# Patient Record
Sex: Female | Born: 1995 | Race: Black or African American | Hispanic: No | Marital: Single | Smoking: Never smoker
Health system: Southern US, Community
[De-identification: ages and names within clinical notes are randomized; demographics above are authoritative.]

## PROBLEM LIST (undated history)

## (undated) DIAGNOSIS — L309 Dermatitis, unspecified: Secondary | ICD-10-CM

## (undated) HISTORY — DX: Dermatitis, unspecified: L30.9

## (undated) HISTORY — PX: NO PAST SURGERIES: SHX2092

---
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
## 2018-02-26 ENCOUNTER — Encounter: Payer: Self-pay | Admitting: Family

## 2018-02-26 ENCOUNTER — Ambulatory Visit: Payer: Self-pay | Admitting: Family

## 2018-02-26 VITALS — BP 118/82 | HR 77 | Temp 98.5°F | Resp 18 | Wt 201.4 lb

## 2018-02-26 DIAGNOSIS — A084 Viral intestinal infection, unspecified: Secondary | ICD-10-CM

## 2018-02-26 DIAGNOSIS — R197 Diarrhea, unspecified: Secondary | ICD-10-CM

## 2018-02-26 DIAGNOSIS — R110 Nausea: Secondary | ICD-10-CM

## 2018-02-26 MED ORDER — ONDANSETRON HCL 8 MG PO TABS
8.0000 mg | ORAL_TABLET | Freq: Three times a day (TID) | ORAL | 0 refills | Status: AC | PRN
Start: 2018-02-26 — End: ?

## 2018-02-26 NOTE — Patient Instructions (Signed)

## 2018-02-26 NOTE — Progress Notes (Signed)
Subjective:     Patient ID: Michelle Nunez, female   DOB: 1996/08/07, 22 y.o.   MRN: [id number]  HPI 22 year old female is in today with c/o nausea, vomiting, and diarrhea that began yesterday after eating [employer] chicken nuggets. She feels better this morning. Less diarrhea but still has some nausea. Was able to eat crackers without difficulty.  Review of Systems  Constitutional: Negative.  Negative for chills.  HENT: Negative.   Respiratory: Negative.   Cardiovascular: Negative.   Gastrointestinal: Positive for diarrhea and nausea.  Endocrine: Negative.   Genitourinary: Negative.   Musculoskeletal: Negative.    Past Medical History:  Diagnosis Date  . Eczema 03/20/2012    Social History   Socioeconomic History  . Marital status: Single    Spouse name: Not on file  . Number of children: Not on file  . Years of education: Not on file  . Highest education level: Not on file  Occupational History  . Not on file  Social Needs  . Financial resource strain: Not on file  . Food insecurity:    Worry: Not on file    Inability: Not on file  . Transportation needs:    Medical: Not on file    Non-medical: Not on file  Tobacco Use  . Smoking status: Passive Smoke Exposure - Never Smoker  Substance and Sexual Activity  . Alcohol use: No    Alcohol/week: 0.0 oz  . Drug use: No  . Sexual activity: Yes    Partners: Male    Birth control/protection: None  Lifestyle  . Physical activity:    Days per week: Not on file    Minutes per session: Not on file  . Stress: Not on file  Relationships  . Social connections:    Talks on phone: Not on file    Gets together: Not on file    Attends religious service: Not on file    Active member of club or organization: Not on file    Attends meetings of clubs or organizations: Not on file    Relationship status: Not on file  . Intimate partner violence:    Fear of current or ex partner: Not on file    Emotionally abused: Not on file   Physically abused: Not on file    Forced sexual activity: Not on file  Other Topics Concern  . Not on file  Social History Narrative  . Not on file    Past Surgical History:  Procedure Laterality Date  . NO PAST SURGERIES      Family History  Problem Relation Age of Onset  . Hypertension Father   . Breast cancer Maternal Aunt   . Prostate cancer Maternal Uncle     No Known Allergies  Current Outpatient Medications on File Prior to Visit  Medication Sig Dispense Refill  . acetaminophen (TYLENOL) 325 MG tablet Take 2 tablets (650 mg total) by mouth every 4 (four) hours as needed for moderate pain (for pain scale < 4). (Patient not taking: Reported on 02/26/2018) 90 tablet 0  . ibuprofen (ADVIL,MOTRIN) 600 MG tablet Take 1 tablet (600 mg total) by mouth every 6 (six) hours as needed for moderate pain. (Patient not taking: Reported on 02/26/2018) 30 tablet 0  . Prenatal Vit-Fe Fumarate-FA (MULTIVITAMIN-PRENATAL) 27-0.8 MG TABS tablet Take 1 tablet by mouth daily at 12 noon.    . senna-docusate (SENOKOT-S) 8.6-50 MG tablet Take 1 tablet by mouth at bedtime. (Patient not taking: Reported on 02/26/2018)  90 tablet 0  . SPRINTEC 28 0.25-35 MG-MCG tablet TAKE 1 TABLET BY MOUTH DAILY. (Patient not taking: Reported on 02/26/2018) 84 tablet 5   No current facility-administered medications on file prior to visit.     BP 118/82 (BP Location: Right Arm, Patient Position: Sitting, Cuff Size: Normal)   Pulse 77   Temp 98.5 F (36.9 C) (Oral)   Resp 18   Wt 201 lb 6.4 oz (91.4 kg)   SpO2 97% chart    Objective:   Physical Exam  Constitutional: She is oriented to person, place, and time. She appears well-developed and well-nourished.  HENT:  Mouth/Throat: Oropharynx is clear and moist.  Neck: Normal range of motion. Neck supple.  Cardiovascular: Normal rate and normal heart sounds.  Pulmonary/Chest: Effort normal and breath sounds normal.  Abdominal: Soft. Bowel sounds are normal. She  exhibits no distension. There is no tenderness.  Neurological: She is alert and oriented to person, place, and time.  Skin: Skin is warm and dry.  Psychiatric: She has a normal mood and affect.       Assessment:     Michelle Nunez was seen today for stomach bug.  Diagnoses and all orders for this visit:  Viral gastroenteritis  Diarrhea, unspecified type  Nausea  Other orders -     ondansetron (ZOFRAN) 8 MG tablet; Take 1 tablet (8 mg total) by mouth every 8 (eight) hours as needed for nausea or vomiting.      Plan:     RTC if symptoms worsen or persist. Recheck as needed

## 2018-07-20 ENCOUNTER — Other Ambulatory Visit: Payer: Self-pay

## 2018-07-20 ENCOUNTER — Encounter (HOSPITAL_COMMUNITY): Payer: Self-pay

## 2018-07-20 ENCOUNTER — Emergency Department (HOSPITAL_COMMUNITY)
Admission: EM | Admit: 2018-07-20 | Discharge: 2018-07-20 | Disposition: A | Discharge status: Home / Self Care | Payer: BLUE CROSS/BLUE SHIELD | Attending: Emergency Medicine | Admitting: Emergency Medicine

## 2018-07-20 DIAGNOSIS — L0291 Cutaneous abscess, unspecified: Secondary | ICD-10-CM

## 2018-07-20 DIAGNOSIS — M65052 Abscess of tendon sheath, left thigh: Secondary | ICD-10-CM | POA: Insufficient documentation

## 2018-07-20 DIAGNOSIS — Z79899 Other long term (current) drug therapy: Secondary | ICD-10-CM | POA: Insufficient documentation

## 2018-07-20 DIAGNOSIS — M79652 Pain in left thigh: Secondary | ICD-10-CM | POA: Diagnosis present

## 2018-07-20 MED ORDER — DOXYCYCLINE HYCLATE 100 MG PO CAPS: 100.0000 mg | ORAL_CAPSULE | Freq: Two times a day (BID) | ORAL | 0 refills | Status: AC

## 2018-07-20 MED ORDER — LIDOCAINE-EPINEPHRINE (PF) 2 %-1:200000 IJ SOLN
20.0000 mL | Freq: Once | INTRAMUSCULAR | Status: AC
  Administered 2018-07-20: 20 mL via INTRADERMAL
  Filled 2018-07-20: qty 20

## 2018-07-20 MED ORDER — NAPROXEN 500 MG PO TABS: 500.0000 mg | ORAL_TABLET | Freq: Two times a day (BID) | ORAL | 0 refills | Status: AC

## 2018-07-20 NOTE — ED Triage Notes (Signed)
Pt presents with 2-3 day h/o abscess to L buttock.  Pt reports bloody drainage that was noted today.

## 2018-07-20 NOTE — ED Provider Notes (Signed)
[place] EMERGENCY DEPARTMENT Provider Note   CSN: [id number] Arrival date & time: 07/20/18  1338     History   Chief Complaint Chief Complaint  Patient presents with  . Abscess    HPI Michelle Nunez is a 22 y.o. female.  HPI Pt noticed a sore on her buttock on the left side a few days ago.  Sx have been gettting worse.  The area is tender to the touch.  She also noticed some drainage today.  No fevers.  No vomiting Past Medical History:  Diagnosis Date  . Eczema 03/20/2012    There are no active problems to display for this patient.   Past Surgical History:  Procedure Laterality Date  . NO PAST SURGERIES       OB History    Gravida  1   Para  1   Term  1   Preterm      AB      Living  1     SAB      TAB      Ectopic      Multiple  0   Live Births  1            Home Medications    Prior to Admission medications   Medication Sig Start Date End Date Taking? Authorizing Provider  acetaminophen (TYLENOL) 325 MG tablet Take 2 tablets (650 mg total) by mouth every 4 (four) hours as needed for moderate pain (for pain scale < 4). Patient not taking: Reported on 02/26/2018 11/18/15   Smiley Houseman, MD  doxycycline (VIBRAMYCIN) 100 MG capsule Take 1 capsule (100 mg total) by mouth 2 (two) times daily for 7 days. 07/20/18 07/27/18  Dorie Rank, MD  ibuprofen (ADVIL,MOTRIN) 600 MG tablet Take 1 tablet (600 mg total) by mouth every 6 (six) hours as needed for moderate pain. Patient not taking: Reported on 02/26/2018 11/18/15   Smiley Houseman, MD  naproxen (NAPROSYN) 500 MG tablet Take 1 tablet (500 mg total) by mouth 2 (two) times daily with a meal. As needed for pain 07/20/18   Dorie Rank, MD  ondansetron (ZOFRAN) 8 MG tablet Take 1 tablet (8 mg total) by mouth every 8 (eight) hours as needed for nausea or vomiting. 02/26/18   Kennyth Arnold, FNP  Prenatal Vit-Fe Fumarate-FA (MULTIVITAMIN-PRENATAL) 27-0.8 MG TABS tablet Take 1 tablet  by mouth daily at 12 noon.    [provider]  senna-docusate (SENOKOT-S) 8.6-50 MG tablet Take 1 tablet by mouth at bedtime. Patient not taking: Reported on 02/26/2018 11/18/15   Smiley Houseman, MD  SPRINTEC 28 0.25-35 MG-MCG tablet TAKE 1 TABLET BY MOUTH DAILY. Patient not taking: Reported on 02/26/2018 10/20/16   Osborne Oman, MD    Family History Family History  Problem Relation Age of Onset  . Hypertension Father   . Breast cancer Maternal Aunt   . Prostate cancer Maternal Uncle     Social History Social History   Tobacco Use  . Smoking status: Never Smoker  . Smokeless tobacco: Never Used  Substance Use Topics  . Alcohol use: No    Alcohol/week: 0.0 standard drinks  . Drug use: No     Allergies   Patient has no known allergies.   Review of Systems Review of Systems  All other systems reviewed and are negative.    Physical Exam Updated Vital Signs BP 118/68   Pulse 89   Temp 98.4 F (36.9 C) (  Oral)   Resp 16   Ht 1.626 m (5\' 4" )   Wt 83.9 kg   SpO2 100%   BMI 31.76 kg/m   Physical Exam  Constitutional: She appears well-developed and well-nourished. No distress.  HENT:  Head: Normocephalic and atraumatic.  Right Ear: External ear normal.  Left Ear: External ear normal.  Eyes: Conjunctivae are normal. Right eye exhibits no discharge. Left eye exhibits no discharge. No scleral icterus.  Neck: Neck supple. No tracheal deviation present.  Cardiovascular: Normal rate.  Pulmonary/Chest: Effort normal. No stridor. No respiratory distress.  Abdominal: She exhibits no distension.  Musculoskeletal: She exhibits no edema.  Left proximal thigh area of induration and tenderness, small area on the skin with sponateous purulent drainage  Neurological: She is alert. Cranial nerve deficit: no gross deficits.  Skin: Skin is warm and dry. No rash noted.  Psychiatric: She has a normal mood and affect.  Nursing note and vitals reviewed.    ED  Treatments / Results  Labs (all labs ordered are listed, but only abnormal results are displayed) Labs Reviewed - No data to display  EKG None  Radiology No results found.  Procedures Procedures (including critical care time)  Medications Ordered in ED Medications  lidocaine-EPINEPHrine (XYLOCAINE W/EPI) 2 %-1:200000 (PF) injection 20 mL (20 mLs Intradermal Given by Other 07/20/18 1554)     Initial Impression / Assessment and Plan / ED Course  I have reviewed the triage vital signs and the nursing notes.  Pertinent labs & imaging results that were available during my care of the patient were reviewed by me and considered in my medical decision making (see chart for details).  Clinical Course as of Jul 20 1640  Sat Jul 20, 2018  1639 I&D performed by PA Valere Dross.     [JK]    Clinical Course User Index [JK] Dorie Rank, MD   Patient presented to the emergency room with a thigh abscess.  Patient tolerated I&D.  Plan on discharge home with course of antibiotics considering  the surrounding erythema.  Packing removal in 1 to 2 days.   Final Clinical Impressions(s) / ED Diagnoses   Final diagnoses:  Abscess    ED Discharge Orders         Ordered    doxycycline (VIBRAMYCIN) 100 MG capsule  2 times daily     07/20/18 1641    naproxen (NAPROSYN) 500 MG tablet  2 times daily with meals     07/20/18 1641           Dorie Rank, MD 07/20/18 1642

## 2018-07-20 NOTE — ED Provider Notes (Signed)
..  Incision and Drainage Date/Time: 07/20/2018 4:36 PM Performed by: Albesa Seen, PA-C Authorized by: Albesa Seen, PA-C   Consent:    Consent obtained:  Verbal   Consent given by:  Patient   Risks discussed:  Bleeding, incomplete drainage and pain Location:    Type:  Abscess   Location:  Lower extremity   Lower extremity location:  Leg   Leg location:  L upper leg Pre-procedure details:    Skin preparation:  Betadine Anesthesia (see MAR for exact dosages):    Anesthesia method:  Local infiltration   Local anesthetic:  Lidocaine 2% WITH epi Procedure type:    Complexity:  Simple Procedure details:    Needle aspiration: no     Incision type: Site aleady open, no incision needed.    Wound management:  Probed and deloculated, irrigated with saline and extensive cleaning   Drainage:  Bloody and purulent   Drainage amount:  Scant   Wound treatment:  Wound left open   Packing materials:  1/2 in iodoform gauze Post-procedure details:    Patient tolerance of procedure:  Tolerated well, no immediate complications      Tamala Julian 07/20/18 1637    Dorie Rank, MD 07/23/18 [phone]

## 2018-07-20 NOTE — Discharge Instructions (Addendum)
Take antibiotics as prescribed, follow-up in 1 to 2 days with a doctor to have the packing removed, continue to do warm soaks daily

## 2018-12-05 NOTE — Progress Notes (Signed)
HPV #2

## 2018-12-09 ENCOUNTER — Encounter: Payer: Self-pay | Admitting: Family Medicine

## 2018-12-09 ENCOUNTER — Ambulatory Visit (INDEPENDENT_AMBULATORY_CARE_PROVIDER_SITE_OTHER): Payer: Medicaid Other | Admitting: Family Medicine

## 2018-12-09 ENCOUNTER — Encounter: Payer: Self-pay | Admitting: *Deleted

## 2018-12-09 VITALS — BP 135/79 | HR 79 | Ht 65.0 in | Wt 206.0 lb

## 2018-12-09 DIAGNOSIS — Z0000 Encounter for general adult medical examination without abnormal findings: Secondary | ICD-10-CM | POA: Diagnosis not present

## 2018-12-09 DIAGNOSIS — Z23 Encounter for immunization: Secondary | ICD-10-CM

## 2018-12-09 DIAGNOSIS — Z3046 Encounter for surveillance of implantable subdermal contraceptive: Secondary | ICD-10-CM | POA: Diagnosis not present

## 2018-12-09 DIAGNOSIS — Z30017 Encounter for initial prescription of implantable subdermal contraceptive: Secondary | ICD-10-CM | POA: Diagnosis not present

## 2018-12-09 MED ORDER — ETONOGESTREL 68 MG ~~LOC~~ IMPL
68.0000 mg | DRUG_IMPLANT | Freq: Once | SUBCUTANEOUS | Status: AC
  Administered 2018-12-09: 68 mg via SUBCUTANEOUS

## 2018-12-09 NOTE — Progress Notes (Signed)
  Nexplanon Removal:  Patient given informed consent for removal of her Implanon, time out was performed.  Signed copy in the chart.  Appropriate time out taken. Nexplanon site identified in patient's LEFT.  Area prepped in usual sterile fashon. 66mL of 2% lidocaine with epi was used to anesthetize the area at the distal end of the implant. A small stab incision was made right beside the implant on the distal portion.  The implanon rod was grasped using hemostats and removed without difficulty.  There was less than 3 cc blood loss.   Nexplanon Insertion:  Patient given informed consent, signed copy in the chart, time out was performed.  Device confirmed in needle. The needle was inserted into the puncture wound already made by the removal in the LEFT arm, then inserted full length of needle and withdrawn per handbook instructions.  Device palpated by physician and patient.  Pt insertion site covered with pressure dressing.   Minimal blood loss.  Pt tolerated the procedure well.

## 2018-12-09 NOTE — Patient Instructions (Signed)
Nexplanon Instructions After Insertion  Keep bandage clean and dry for 24 hours  May use ice/Tylenol/Ibuprofen for soreness or pain  If you develop fever, drainage or increased warmth from incision site-contact office immediately   

## 2018-12-09 NOTE — Progress Notes (Signed)
GYNECOLOGY ANNUAL PREVENTATIVE CARE ENCOUNTER NOTE  Subjective:   Michelle Nunez is a 23 y.o. G53P1001 female here for a routine annual gynecologic exam.  Current complaints: none.   Denies abnormal vaginal bleeding, discharge, pelvic pain, problems with intercourse or other gynecologic concerns.    Gynecologic History No LMP recorded. Patient has had an implant. Patient is sexually active  Contraception: Nexplanon Last Pap: n/a. Last mammogram: n/a  Obstetric History OB History  Gravida Para Term Preterm AB Living  1 1 1     1   SAB TAB Ectopic Multiple Live Births        0 1    # Outcome Date GA Lbr Len/2nd Weight Sex Delivery Anes PTL Lv  1 Term 11/16/15 [redacted]w[redacted]d 15:00 / 01:35 5 lb 12.1 oz (2.61 kg) F Vag-Spont EPI  LIV    Past Medical History:  Diagnosis Date  . Eczema 03/20/2012    Past Surgical History:  Procedure Laterality Date  . NO PAST SURGERIES      Current Outpatient Medications on File Prior to Visit  Medication Sig Dispense Refill  . acetaminophen (TYLENOL) 325 MG tablet Take 2 tablets (650 mg total) by mouth every 4 (four) hours as needed for moderate pain (for pain scale < 4). (Patient not taking: Reported on 02/26/2018) 90 tablet 0  . ibuprofen (ADVIL,MOTRIN) 600 MG tablet Take 1 tablet (600 mg total) by mouth every 6 (six) hours as needed for moderate pain. (Patient not taking: Reported on 02/26/2018) 30 tablet 0  . naproxen (NAPROSYN) 500 MG tablet Take 1 tablet (500 mg total) by mouth 2 (two) times daily with a meal. As needed for pain (Patient not taking: Reported on 12/09/2018) 14 tablet 0  . ondansetron (ZOFRAN) 8 MG tablet Take 1 tablet (8 mg total) by mouth every 8 (eight) hours as needed for nausea or vomiting. (Patient not taking: Reported on 12/09/2018) 20 tablet 0  . Prenatal Vit-Fe Fumarate-FA (MULTIVITAMIN-PRENATAL) 27-0.8 MG TABS tablet Take 1 tablet by mouth daily at 12 noon.    . senna-docusate (SENOKOT-S) 8.6-50 MG tablet Take 1 tablet by mouth  at bedtime. (Patient not taking: Reported on 02/26/2018) 90 tablet 0  . SPRINTEC 28 0.25-35 MG-MCG tablet TAKE 1 TABLET BY MOUTH DAILY. (Patient not taking: Reported on 02/26/2018) 84 tablet 5   No current facility-administered medications on file prior to visit.     No Known Allergies  Social History   Socioeconomic History  . Marital status: Single    Spouse name: Not on file  . Number of children: Not on file  . Years of education: Not on file  . Highest education level: Not on file  Occupational History  . Not on file  Social Needs  . Financial resource strain: Not on file  . Food insecurity:    Worry: Not on file    Inability: Not on file  . Transportation needs:    Medical: Not on file    Non-medical: Not on file  Tobacco Use  . Smoking status: Never Smoker  . Smokeless tobacco: Never Used  Substance and Sexual Activity  . Alcohol use: No    Alcohol/week: 0.0 standard drinks  . Drug use: No  . Sexual activity: Yes    Partners: Male    Birth control/protection: Implant  Lifestyle  . Physical activity:    Days per week: Not on file    Minutes per session: Not on file  . Stress: Not on file  Relationships  .  Social connections:    Talks on phone: Not on file    Gets together: Not on file    Attends religious service: Not on file    Active member of club or organization: Not on file    Attends meetings of clubs or organizations: Not on file    Relationship status: Not on file  . Intimate partner violence:    Fear of current or ex partner: Not on file    Emotionally abused: Not on file    Physically abused: Not on file    Forced sexual activity: Not on file  Other Topics Concern  . Not on file  Social History Narrative  . Not on file    Family History  Problem Relation Age of Onset  . Hypertension Father   . Breast cancer Maternal Aunt   . Prostate cancer Maternal Uncle     The following portions of the patient's history were reviewed and updated as  appropriate: allergies, current medications, past family history, past medical history, past social history, past surgical history and problem list.  Review of Systems Pertinent items are noted in HPI.   Objective:  BP 135/79   Pulse 79   Ht 5\' 5"  (1.651 m)   Wt 206 lb (93.4 kg)   BMI 34.28 kg/m  Wt Readings from Last 3 Encounters:  12/09/18 206 lb (93.4 kg)  07/20/18 185 lb (83.9 kg)  02/26/18 201 lb 6.4 oz (91.4 kg)     CONSTITUTIONAL: Well-developed, well-nourished female in no acute distress.  HENT:  Normocephalic, atraumatic, External right and left ear normal. Oropharynx is clear and moist EYES: Conjunctivae and EOM are normal. Pupils are equal, round, and reactive to light. No scleral icterus.  NECK: Normal range of motion, supple, no masses.  Normal thyroid.   CARDIOVASCULAR: Normal heart rate noted, regular rhythm RESPIRATORY: Clear to auscultation bilaterally. Effort and breath sounds normal, no problems with respiration noted. BREASTS: Symmetric in size. No masses, skin changes, nipple drainage, or lymphadenopathy. ABDOMEN: Soft, normal bowel sounds, no distention noted.  No tenderness, rebound or guarding.  PELVIC: Normal appearing external genitalia; normal appearing vaginal mucosa and cervix.  No abnormal discharge noted.  Normal uterine size, no other palpable masses, no uterine or adnexal tenderness. MUSCULOSKELETAL: Normal range of motion. No tenderness.  No cyanosis, clubbing, or edema.  2+ distal pulses. SKIN: Skin is warm and dry. No rash noted. Not diaphoretic. No erythema. No pallor. NEUROLOGIC: Alert and oriented to person, place, and time. Normal reflexes, muscle tone coordination. No cranial nerve deficit noted. PSYCHIATRIC: Normal mood and affect. Normal behavior. Normal judgment and thought content.  Assessment:  Annual gynecologic examination with pap smear   Plan:  1. Well Woman Exam No concerns. Flu vaccine given. DIscussed gardasil - patient would  like to think about it. Handout given  2. Encounter for Nexplanon removal See separate note  3. Insertion of Nexplanon See separate procedure note.   Routine preventative health maintenance measures emphasized. Please refer to After Visit Summary for other counseling recommendations.    Loma Boston, [facility] for [employer]

## 2019-11-10 ENCOUNTER — Encounter: Payer: Self-pay | Admitting: Radiology

## 2020-03-24 ENCOUNTER — Emergency Department (HOSPITAL_COMMUNITY): Payer: Self-pay

## 2020-03-24 ENCOUNTER — Other Ambulatory Visit: Payer: Self-pay

## 2020-03-24 ENCOUNTER — Emergency Department (HOSPITAL_COMMUNITY)
Admission: EM | Admit: 2020-03-24 | Discharge: 2020-03-24 | Disposition: A | Discharge status: Home / Self Care | Payer: Self-pay | Attending: Emergency Medicine | Admitting: Emergency Medicine

## 2020-03-24 DIAGNOSIS — D329 Benign neoplasm of meninges, unspecified: Secondary | ICD-10-CM | POA: Insufficient documentation

## 2020-03-24 DIAGNOSIS — Z20822 Contact with and (suspected) exposure to covid-19: Secondary | ICD-10-CM | POA: Insufficient documentation

## 2020-03-24 DIAGNOSIS — R519 Headache, unspecified: Secondary | ICD-10-CM | POA: Insufficient documentation

## 2020-03-24 LAB — SARS CORONAVIRUS 2 BY RT PCR (HOSPITAL ORDER, PERFORMED IN ~~LOC~~ HOSPITAL LAB): SARS Coronavirus 2: NEGATIVE

## 2020-03-24 MED ORDER — KETOROLAC TROMETHAMINE 60 MG/2ML IM SOLN
60.0000 mg | Freq: Once | INTRAMUSCULAR | Status: AC
  Administered 2020-03-24: 20:00:00 60 mg via INTRAMUSCULAR
  Filled 2020-03-24: qty 2

## 2020-03-24 NOTE — ED Provider Notes (Signed)
[facility] EMERGENCY DEPARTMENT Provider Note   CSN: [id number] Arrival date & time: 03/24/20  1817     History Chief Complaint  Patient presents with  . Headache    Michelle Nunez is a 24 y.o. female.  Patient-year-old female with past medical history of eczema presenting to the emergency department for frontal headache since Tuesday morning.  Reports that she has no history of headaches in the past.  She has throbbing pain in her head which is worse with moving her head.  Reports also feeling hot and cold chills.  Denies any other symptoms.        Past Medical History:  Diagnosis Date  . Eczema 03/20/2012    There are no problems to display for this patient.   Past Surgical History:  Procedure Laterality Date  . NO PAST SURGERIES       OB History    Gravida  1   Para  1   Term  1   Preterm      AB      Living  1     SAB      TAB      Ectopic      Multiple  0   Live Births  1           Family History  Problem Relation Age of Onset  . Hypertension Father   . Breast cancer Maternal Aunt   . Prostate cancer Maternal Uncle     Social History   Tobacco Use  . Smoking status: Never Smoker  . Smokeless tobacco: Never Used  Substance Use Topics  . Alcohol use: No    Alcohol/week: 0.0 standard drinks  . Drug use: No    Home Medications Prior to Admission medications   Medication Sig Start Date End Date Taking? Authorizing Provider  acetaminophen (TYLENOL) 325 MG tablet Take 2 tablets (650 mg total) by mouth every 4 (four) hours as needed for moderate pain (for pain scale < 4). Patient not taking: Reported on 02/26/2018 11/18/15   Smiley Houseman, MD  ibuprofen (ADVIL,MOTRIN) 600 MG tablet Take 1 tablet (600 mg total) by mouth every 6 (six) hours as needed for moderate pain. Patient not taking: Reported on 02/26/2018 11/18/15   Smiley Houseman, MD  naproxen (NAPROSYN) 500 MG tablet Take 1 tablet (500 mg total) by  mouth 2 (two) times daily with a meal. As needed for pain Patient not taking: Reported on 12/09/2018 07/20/18   Dorie Rank, MD  ondansetron (ZOFRAN) 8 MG tablet Take 1 tablet (8 mg total) by mouth every 8 (eight) hours as needed for nausea or vomiting. Patient not taking: Reported on 12/09/2018 02/26/18   Dutch Quint B, FNP  senna-docusate (SENOKOT-S) 8.6-50 MG tablet Take 1 tablet by mouth at bedtime. Patient not taking: Reported on 02/26/2018 11/18/15   Smiley Houseman, MD  SPRINTEC 28 0.25-35 MG-MCG tablet TAKE 1 TABLET BY MOUTH DAILY. Patient not taking: Reported on 02/26/2018 10/20/16   Osborne Oman, MD    Allergies    Patient has no known allergies.  Review of Systems   Review of Systems  Constitutional: Positive for chills. Negative for appetite change and fatigue.  HENT: Negative.   Respiratory: Negative for cough and shortness of breath.   Cardiovascular: Negative for chest pain.  Gastrointestinal: Negative for abdominal pain, diarrhea, nausea and vomiting.  Genitourinary: Negative.   Musculoskeletal: Negative.   Skin: Negative for rash.  Neurological: Positive for  headaches. Negative for dizziness, tremors, seizures, syncope, speech difficulty, weakness and light-headedness.  Psychiatric/Behavioral: Negative for confusion.    Physical Exam Updated Vital Signs BP 118/81   Pulse (!) 111   Temp 100 F (37.8 C)   Resp 18   SpO2 93%   Physical Exam Vitals and nursing note reviewed.  Constitutional:      General: She is not in acute distress.    Appearance: Normal appearance. She is well-developed. She is not ill-appearing, toxic-appearing or diaphoretic.  HENT:     Head: Normocephalic.  Eyes:     General: No visual field deficit.    Extraocular Movements: Extraocular movements intact.     Right eye: Normal extraocular motion and no nystagmus.     Left eye: Normal extraocular motion and no nystagmus.     Conjunctiva/sclera: Conjunctivae normal.     Pupils:  Pupils are equal, round, and reactive to light.  Cardiovascular:     Rate and Rhythm: Normal rate and regular rhythm.  Pulmonary:     Effort: Pulmonary effort is normal.  Abdominal:     Palpations: Abdomen is soft.  Musculoskeletal:     Cervical back: Neck supple.  Skin:    General: Skin is warm and dry.  Neurological:     Mental Status: She is alert.     Cranial Nerves: No cranial nerve deficit, dysarthria or facial asymmetry.     Sensory: No sensory deficit.     Motor: No weakness.     Gait: Gait normal.  Psychiatric:        Mood and Affect: Mood normal.     ED Results / Procedures / Treatments   Labs (all labs ordered are listed, but only abnormal results are displayed) Labs Reviewed  SARS CORONAVIRUS 2 BY RT PCR ([facility])    EKG None  Radiology CT Head Wo Contrast  Result Date: 03/24/2020 CLINICAL DATA:  Headache. EXAM: CT HEAD WITHOUT CONTRAST TECHNIQUE: Contiguous axial images were obtained from the base of the skull through the vertex without intravenous contrast. COMPARISON:  None. FINDINGS: Brain: No evidence of acute infarction, hemorrhage, hydrocephalus, extra-axial collection or mass lesion/mass effect. A 7 mm curvilinear focus of calcification is seen within the posterior aspect of the right lateral ventricle (axial CT image 17, CT series number 3). Vascular: No hyperdense vessel is identified. Skull: Normal. Negative for fracture or focal lesion. Sinuses/Orbits: No acute finding. Other: None. IMPRESSION: 1. No acute intracranial abnormality. 2. 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 MRI correlation is recommended. Electronically Signed   By: Virgina Norfolk M.D.   On: 03/24/2020 21:19    Procedures Procedures (including critical care time)  Medications Ordered in ED Medications  ketorolac  (TORADOL) injection 60 mg (60 mg Intramuscular Given 03/24/20 1953)    ED Course  I have reviewed the triage vital signs and the nursing notes.  Pertinent labs & imaging results that were available during my care of the patient were reviewed by me and considered in my medical decision making (see chart for details).  Clinical Course as of Mar 24 2133  Wed Mar 24, 2020  2009 Patient presenting with headache and chills. Well appearing, normal neuro exam. Patient given toradol and swabbed for covid. Patient asking for head CT. Discussed with patient that this is not necessary given her symptoms. Patient reports family hx of spontaneous  subarachnoid hemorrhage.  I offered further reassurance to the patient that this likely is not the cause of her headache.  Patient insist that she get a head CT scan and patient's mother also and states that head CT scan be performed.  Discussed risks and benefits of a head CT with the patient including exposure to radiation, cost, time and a likely negative exam.  Patient reports she understands but still insists on head CT for her headache.  Head CT was ordered.   [KM]    Clinical Course User Index [KM] Kristine Royal   MDM Rules/Calculators/A&P                      Based on review of vitals, medical screening exam, lab work and/or imaging, there does not appear to be an acute, emergent etiology for the patient's symptoms. Counseled pt on good return precautions and encouraged both PCP and ED follow-up as needed.  Prior to discharge, I also discussed incidental imaging findings with patient in detail and advised appropriate, recommended follow-up in detail.  Clinical Impression: 1. Acute nonintractable headache, unspecified headache type     Disposition: Discharge  Prior to providing a prescription for a controlled substance, I independently reviewed the patient's recent prescription history on the [facility].  The patient had no recent or regular prescriptions and was deemed appropriate for a brief, less than 3 day prescription of narcotic for acute analgesia.  This note was prepared with assistance of Systems analyst. Occasional wrong-word or sound-a-like substitutions may have occurred due to the inherent limitations of voice recognition software.  Final Clinical Impression(s) / ED Diagnoses Final diagnoses:  Acute nonintractable headache, unspecified headache type    Rx / DC Orders ED Discharge Orders    None       Kristine Royal 03/24/20 2135    Charlesetta Shanks, MD 03/26/20 1626

## 2020-03-24 NOTE — ED Triage Notes (Signed)
Pt here with reports of throbbing headache since yesterday. Pt also states sometimes at night she gets hot. Pt in NAD.

## 2020-03-24 NOTE — Discharge Instructions (Signed)
You are seen today for headache.  Your Covid test is negative.  Your CT scan was reassuring and there is no signs of any bleed.  Incidentally, we did see that there is a tiny benign tumor called a meningioma on her CT scan.  Meningioma is a usually noncancerous tumor that arises from the membranes surrounding the brain and spinal cord. It isn't clear what causes a meningioma. Radiation therapy, female hormones, and genetics may play a role.  It is recommended that you follow-up with your primary medical doctor who may perform other imaging studies such as an MRI.  Continue to take Motrin and/or Tylenol for your headache.

## 2020-03-25 ENCOUNTER — Encounter (HOSPITAL_COMMUNITY): Payer: Self-pay | Admitting: Pediatrics

## 2020-03-25 ENCOUNTER — Other Ambulatory Visit: Payer: Self-pay

## 2020-03-25 ENCOUNTER — Emergency Department (HOSPITAL_COMMUNITY)
Admission: EM | Admit: 2020-03-25 | Discharge: 2020-03-25 | Disposition: A | Discharge status: Home / Self Care | Payer: Medicaid Other | Attending: Emergency Medicine | Admitting: Emergency Medicine

## 2020-03-25 DIAGNOSIS — R509 Fever, unspecified: Secondary | ICD-10-CM | POA: Insufficient documentation

## 2020-03-25 DIAGNOSIS — E860 Dehydration: Secondary | ICD-10-CM | POA: Insufficient documentation

## 2020-03-25 DIAGNOSIS — R519 Headache, unspecified: Secondary | ICD-10-CM | POA: Insufficient documentation

## 2020-03-25 DIAGNOSIS — R112 Nausea with vomiting, unspecified: Secondary | ICD-10-CM | POA: Insufficient documentation

## 2020-03-25 MED ORDER — ONDANSETRON 4 MG PO TBDP: 4.0000 mg | ORAL_TABLET | Freq: Three times a day (TID) | ORAL | 0 refills | Status: AC | PRN

## 2020-03-25 MED ORDER — IBUPROFEN 400 MG PO TABS: 600.0000 mg | ORAL_TABLET | Freq: Once | ORAL | Status: DC

## 2020-03-25 MED ORDER — IBUPROFEN 800 MG PO TABS
800.0000 mg | ORAL_TABLET | Freq: Once | ORAL | Status: AC
  Administered 2020-03-25: 800 mg via ORAL
  Filled 2020-03-25: qty 1

## 2020-03-25 MED ORDER — ACETAMINOPHEN 325 MG PO TABS
650.0000 mg | ORAL_TABLET | Freq: Once | ORAL | Status: AC
  Administered 2020-03-25: 650 mg via ORAL
  Filled 2020-03-25: qty 2

## 2020-03-25 MED ORDER — ONDANSETRON 4 MG PO TBDP
8.0000 mg | ORAL_TABLET | Freq: Once | ORAL | Status: AC
  Administered 2020-03-25: 8 mg via ORAL
  Filled 2020-03-25: qty 2

## 2020-03-25 NOTE — ED Notes (Signed)
Discussed elevated temp with PA

## 2020-03-25 NOTE — ED Notes (Signed)
Discharge instructions including medications discussed with pt. Pt verbalized understanding with no questions at this time.

## 2020-03-25 NOTE — Discharge Instructions (Addendum)
Your headache is likely secondary to a viral illness of some kind. Please use Tylenol or ibuprofen for pain.  You may use 600 mg ibuprofen every 6 hours or 1000 mg of Tylenol every 6 hours.  You may choose to alternate between the 2.  This would be most effective.  Not to exceed 4 g of Tylenol within 24 hours.  Not to exceed 3200 mg ibuprofen 24 hours.

## 2020-03-25 NOTE — ED Provider Notes (Signed)
[facility] EMERGENCY DEPARTMENT Provider Note   CSN: [id number] Arrival date & time: 03/25/20  1751     History Chief Complaint  Patient presents with  . Headache    Michelle Nunez is a 24 y.o. female.  HPI Patient is a 24 year old female with past medical history significant for eczema presenting today with headache that started Tuesday and has been intermittent since.  She states that she was seen yesterday emergency department and requested a head CT.  She states that she was given medications that made her feel better and she went home and states that her headache came back after 6 or 7 hours.  She states that she took 1 tablet of Tylenol which did not improve her headache which prompted her to return to ED this evening for reevaluation.  She endorses some nausea and one episode of vomiting.  She denies any abdominal pain, dysuria, frequency, urgency, hematuria, denies pelvic pain, flank pain, chest pain or shortness of breath.  She denies any cough, cold, congestion, runny nose, sore throat.  She denies any neck stiffness or blurry vision, denies any confusion or lethargy.  She states that she has not felt that she had a fever and denies any chills.      Past Medical History:  Diagnosis Date  . Eczema 03/20/2012    There are no problems to display for this patient.   Past Surgical History:  Procedure Laterality Date  . NO PAST SURGERIES       OB History    Gravida  1   Para  1   Term  1   Preterm      AB      Living  1     SAB      TAB      Ectopic      Multiple  0   Live Births  1           Family History  Problem Relation Age of Onset  . Hypertension Father   . Breast cancer Maternal Aunt   . Prostate cancer Maternal Uncle     Social History   Tobacco Use  . Smoking status: Never Smoker  . Smokeless tobacco: Never Used  Substance Use Topics  . Alcohol use: No    Alcohol/week: 0.0 standard drinks  . Drug use: No     Home Medications Prior to Admission medications   Medication Sig Start Date End Date Taking? Authorizing Provider  acetaminophen (TYLENOL) 325 MG tablet Take 2 tablets (650 mg total) by mouth every 4 (four) hours as needed for moderate pain (for pain scale < 4). Patient not taking: Reported on 02/26/2018 11/18/15   Smiley Houseman, MD  ibuprofen (ADVIL,MOTRIN) 600 MG tablet Take 1 tablet (600 mg total) by mouth every 6 (six) hours as needed for moderate pain. Patient not taking: Reported on 02/26/2018 11/18/15   Smiley Houseman, MD  naproxen (NAPROSYN) 500 MG tablet Take 1 tablet (500 mg total) by mouth 2 (two) times daily with a meal. As needed for pain Patient not taking: Reported on 12/09/2018 07/20/18   Dorie Rank, MD  ondansetron (ZOFRAN ODT) 4 MG disintegrating tablet Take 1 tablet (4 mg total) by mouth every 8 (eight) hours as needed for nausea or vomiting. 03/25/20   Reade Trefz, Kathleene Hazel, PA  ondansetron (ZOFRAN) 8 MG tablet Take 1 tablet (8 mg total) by mouth every 8 (eight) hours as needed for nausea or vomiting. Patient not  taking: Reported on 12/09/2018 02/26/18   Kennyth Arnold, FNP  senna-docusate (SENOKOT-S) 8.6-50 MG tablet Take 1 tablet by mouth at bedtime. Patient not taking: Reported on 02/26/2018 11/18/15   Smiley Houseman, MD  SPRINTEC 28 0.25-35 MG-MCG tablet TAKE 1 TABLET BY MOUTH DAILY. Patient not taking: Reported on 02/26/2018 10/20/16   Osborne Oman, MD    Allergies    Patient has no known allergies.  Review of Systems   Review of Systems  Constitutional: Negative for chills and fever.  HENT: Negative for congestion.   Eyes: Negative for pain.  Respiratory: Negative for cough and shortness of breath.   Cardiovascular: Negative for chest pain and leg swelling.  Gastrointestinal: Positive for nausea and vomiting. Negative for abdominal pain.  Genitourinary: Negative for dysuria.  Musculoskeletal: Negative for myalgias.  Skin: Negative for rash.   Neurological: Positive for headaches. Negative for dizziness.    Physical Exam Updated Vital Signs BP 123/69 (BP Location: Left Arm)   Pulse 86   Temp (!) 100.5 F (38.1 C) (Oral)   Resp 20   SpO2 100%   Physical Exam Vitals and nursing note reviewed.  Constitutional:      General: She is not in acute distress.    Comments: Patient is alert, pleasant 24 year old female no acute distress sitting comfortably in bed texting on her phone.  Patient is conversant and well-appearing.  She does not appear toxic or confused.  HENT:     Head: Normocephalic and atraumatic.     Nose: Nose normal.     Mouth/Throat:     Comments: Dry oral mucosa Eyes:     General: No scleral icterus. Cardiovascular:     Rate and Rhythm: Normal rate and regular rhythm.     Pulses: Normal pulses.     Heart sounds: Normal heart sounds.  Pulmonary:     Effort: Pulmonary effort is normal. No respiratory distress.     Breath sounds: No wheezing.  Abdominal:     Palpations: Abdomen is soft.     Tenderness: There is no abdominal tenderness. There is no right CVA tenderness, left CVA tenderness, guarding or rebound.  Musculoskeletal:     Cervical back: Normal range of motion.     Right lower leg: No edema.     Left lower leg: No edema.     Comments: Strength 5/5 bilateral lower and upper extremities flexion-extension of the knees, ankles, grip of hands, flexion-extension of elbow.  Skin:    General: Skin is warm and dry.     Capillary Refill: Capillary refill takes less than 2 seconds.     Comments: No rashes  Neurological:     Mental Status: She is alert. Mental status is at baseline.     Comments: Alert and oriented to self, place, time and event.   Speech is fluent, clear without dysarthria or dysphasia.   Strength 5/5 in upper/lower extremities  Sensation intact in upper/lower extremities   Normal gait.  Negative Romberg. No pronator drift.  Normal finger-to-nose and feet tapping.  CN I not  tested  CN II grossly intact visual fields bilaterally. Did not visualize posterior eye.   CN III, IV, VI PERRLA and EOMs intact bilaterally  CN V Intact sensation to sharp and light touch to the face  CN VII facial movements symmetric  CN VIII not tested  CN IX, X no uvula deviation, symmetric rise of soft palate  CN XI 5/5 SCM and trapezius strength bilaterally  CN  XII Midline tongue protrusion, symmetric L/R movements   Psychiatric:        Mood and Affect: Mood normal.        Behavior: Behavior normal.     ED Results / Procedures / Treatments   Labs (all labs ordered are listed, but only abnormal results are displayed) Labs Reviewed - No data to display  EKG None  Radiology CT Head Wo Contrast  Result Date: 03/24/2020 CLINICAL DATA:  Headache. EXAM: CT HEAD WITHOUT CONTRAST TECHNIQUE: Contiguous axial images were obtained from the base of the skull through the vertex without intravenous contrast. COMPARISON:  None. FINDINGS: Brain: No evidence of acute infarction, hemorrhage, hydrocephalus, extra-axial collection or mass lesion/mass effect. A 7 mm curvilinear focus of calcification is seen within the posterior aspect of the right lateral ventricle (axial CT image 17, CT series number 3). Vascular: No hyperdense vessel is identified. Skull: Normal. Negative for fracture or focal lesion. Sinuses/Orbits: No acute finding. Other: None. IMPRESSION: 1. No acute intracranial abnormality. 2. 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 MRI correlation is recommended. Electronically Signed   By: Virgina Norfolk M.D.   On: 03/24/2020 21:19    Procedures Procedures (including critical care time)  Medications Ordered in ED Medications  acetaminophen (TYLENOL) tablet 650 mg (650 mg Oral Given 03/25/20 2003)  ondansetron (ZOFRAN-ODT) disintegrating tablet 8 mg (8 mg Oral Given  03/25/20 2003)  ibuprofen (ADVIL) tablet 800 mg (800 mg Oral Given 03/25/20 2002)    ED Course  I have reviewed the triage vital signs and the nursing notes.  Pertinent labs & imaging results that were available during my care of the patient were reviewed by me and considered in my medical decision making (see chart for details).    MDM Rules/Calculators/A&P                      Patient is a 24 year old female with a circumferential headache but is been ongoing intermittently since Tuesday.  She went to emergency department yesterday states that she felt better and returned today when it got worse.  She states that between the 2 ER visit she only took 1 500 mg Tylenol.  She states that did not improve her symptoms and therefore she came back.  On my review of EMR patient was discharged from ER yesterday after 60 mg Toradol injection; patient had a CT scan of the head done at her ED visit yesterday.  There was a small calcified area that may be a questionable meningioma.  Her headaches are intermittent she has an associated fever I have very low suspicion for the meningioma causing her headaches.  She has had some nausea and one episode of vomiting however she has no blurry vision.  Very low suspicion for increased ICP causing her symptoms and very low suspicion for meningitis/encephalitis.  She has vital signs within normal limits apart from the fever.  Given that she has a fever and has not been treating it with any medications nor she been taking Tylenol or ibuprofen--I suspect that her symptoms would improve given these medications.  We will treat her with Tylenol and ibuprofen.  Will have her eat and drink here as she has had nothing to eat today has only had some soda to drink.  Will reevaluate.  I discussed case my attending physician who is agreeable to plan.  I reassessed patient after  she was given Tylenol ibuprofen.  She states her pain is significantly improved.  Her fever has improved to  100.5 and her heart rate improved to 86.  She has tolerated p.o. has had no nausea or vomiting since she presented to emergency department.  Will discharge at this time with Zofran and recommendations on Tylenol ibuprofen use.  I discussed with patient my concern for Covid as she did have a negative Covid test done yesterday no negation for repeat however it is possible that she had a false negative.  She also does appear somewhat dehydrated on physical exam.  Riddhi K Strausser was evaluated in Emergency Department on 03/25/2020 for the symptoms described in the history of present illness. She was evaluated in the context of the global COVID-19 pandemic, which necessitated consideration that the patient might be at risk for infection with the SARS-CoV-2 virus that causes COVID-19. Institutional protocols and algorithms that pertain to the evaluation of patients at risk for COVID-19 are in a state of rapid change based on information released by regulatory bodies including the CDC and federal and state organizations. These policies and algorithms were followed during the patient's care in the ED.   Final Clinical Impression(s) / ED Diagnoses Final diagnoses:  Bad headache  Dehydration  Fever, unspecified fever cause    Rx / DC Orders ED Discharge Orders         Ordered    ondansetron (ZOFRAN ODT) 4 MG disintegrating tablet  Every 8 hours PRN     03/25/20 2122           Tedd Sias, [place] 03/25/20 2123    Carmin Muskrat, MD 03/25/20 2141

## 2020-03-25 NOTE — ED Triage Notes (Signed)
Pt c/o headache started Monday / Tuesday night; stated she was seen yesterday and was discharge. Pt stated she took some tylenol and it doesn't work.

## 2021-06-21 ENCOUNTER — Other Ambulatory Visit: Payer: Self-pay

## 2021-06-21 ENCOUNTER — Ambulatory Visit (INDEPENDENT_AMBULATORY_CARE_PROVIDER_SITE_OTHER): Payer: Medicaid Other | Admitting: Family Medicine

## 2021-06-21 ENCOUNTER — Encounter: Payer: Self-pay | Admitting: Family Medicine

## 2021-06-21 VITALS — BP 143/82 | HR 71 | Wt 222.0 lb

## 2021-06-21 DIAGNOSIS — Z3009 Encounter for other general counseling and advice on contraception: Secondary | ICD-10-CM

## 2021-06-21 DIAGNOSIS — Z3046 Encounter for surveillance of implantable subdermal contraceptive: Secondary | ICD-10-CM

## 2021-06-21 NOTE — Progress Notes (Signed)
  Contraception/Family Planning VISIT ENCOUNTER NOTE  Subjective:   Michelle Nunez is a 25 y.o. G69P1001 female here for reproductive life counseling.  Desires effectiveness from [facility] [place].  Reports she does not want a pregnancy in the next year. Nexplanon placed in 2020.   Denies abnormal vaginal bleeding, discharge, pelvic pain, problems with intercourse or other gynecologic concerns.    Gynecologic History No LMP recorded. Patient has had an implant. Contraception: Nexplanon  Health Maintenance Due  Topic Date Due   COVID-19 Vaccine (1) Never done   HPV VACCINES (1 - 2-dose series) Never done   Hepatitis C Screening  Never done   CHLAMYDIA SCREENING  11/09/2016   PAP-Cervical Cytology Screening  Never done   PAP SMEAR-Modifier  Never done   INFLUENZA VACCINE  06/13/2021   The following portions of the patient's history were reviewed and updated as appropriate: allergies, current medications, past family history, past medical history, past social history, past surgical history and problem list.  Review of Systems Pertinent items are noted in HPI.   Objective:  BP (!) 143/82   Pulse 71   Wt 222 lb (100.7 kg)   BMI 36.94 kg/m  Gen: well appearing, NAD HEENT: no scleral icterus CV: RR Lung: Normal WOB Ext: warm well perfused  PELVIC: not indicated- declined pap toda, Declined STI screening. Only wanted nexplanon removed/contraceptive discussion  Nexplanon Removal Patient identified, informed consent performed, consent signed.   Appropriate time out taken. Nexplanon site identified.  Area prepped in usual sterile fashon. 3 ml of 1% lidocaine with Epinephrine was used to anesthetize the area at the distal end of the implant and along implant site. A small stab incision was made right beside the implant on the distal portion.  The Nexplanon rod was grasped using hemostats and removed without difficulty.  There was minimal blood loss. There were no complications.  Steri-strips were  applied over the small incision.  A pressure bandage was applied to reduce any bruising.  The patient tolerated the procedure well and was given post procedure instructions.      Assessment and Plan:   Contraception counseling: Reviewed all forms of birth control options in the tiered based approach. available including abstinence; over the counter/barrier methods; hormonal contraceptive medication including pill, patch, ring, injection,contraceptive implant, ECP; hormonal and nonhormonal IUDs; permanent sterilization options including vasectomy and the various tubal sterilization modalities. Risks, benefits, and typical effectiveness rates were reviewed.  Questions were answered.  Written information was also given to the patient to review.    Patient desires no method-- Discussed that she DOES NOT want a pregnancy and that given she is sexually active she is at risk for pregnancy. Declined method today despite counseling. If patient calls and desire to start a method happy to prescribe OCP or depo.   She will follow up in  3-4 for surveillance.  She was told to call with any further questions, or with any concerns about this method of contraception.  Emphasized use of condoms 100% of the time for STI prevention.  #HM - declined pap and STI screening today  Please refer to After Visit Summary for other counseling recommendations.   Return in about 4 weeks (around 07/19/2021) for Yearly wellness exam, pap.  Caren Macadam, MD

## 2021-07-12 ENCOUNTER — Emergency Department (HOSPITAL_COMMUNITY)
Admission: EM | Admit: 2021-07-12 | Discharge: 2021-07-12 | Disposition: A | Discharge status: Home / Self Care | Payer: Worker's Compensation | Attending: Student | Admitting: Student

## 2021-07-12 ENCOUNTER — Emergency Department (HOSPITAL_COMMUNITY): Payer: Worker's Compensation

## 2021-07-12 ENCOUNTER — Other Ambulatory Visit: Payer: Self-pay

## 2021-07-12 ENCOUNTER — Emergency Department (HOSPITAL_COMMUNITY): Payer: Medicaid Other

## 2021-07-12 ENCOUNTER — Encounter (HOSPITAL_COMMUNITY): Payer: Self-pay | Admitting: *Deleted

## 2021-07-12 DIAGNOSIS — Z23 Encounter for immunization: Secondary | ICD-10-CM | POA: Insufficient documentation

## 2021-07-12 DIAGNOSIS — S62634A Displaced fracture of distal phalanx of right ring finger, initial encounter for closed fracture: Secondary | ICD-10-CM

## 2021-07-12 DIAGNOSIS — S62604A Fracture of unspecified phalanx of right ring finger, initial encounter for closed fracture: Secondary | ICD-10-CM | POA: Insufficient documentation

## 2021-07-12 DIAGNOSIS — W228XXA Striking against or struck by other objects, initial encounter: Secondary | ICD-10-CM | POA: Diagnosis not present

## 2021-07-12 DIAGNOSIS — Y990 Civilian activity done for income or pay: Secondary | ICD-10-CM | POA: Diagnosis not present

## 2021-07-12 DIAGNOSIS — M79644 Pain in right finger(s): Secondary | ICD-10-CM | POA: Diagnosis not present

## 2021-07-12 DIAGNOSIS — S61309A Unspecified open wound of unspecified finger with damage to nail, initial encounter: Secondary | ICD-10-CM

## 2021-07-12 DIAGNOSIS — S6991XA Unspecified injury of right wrist, hand and finger(s), initial encounter: Secondary | ICD-10-CM | POA: Diagnosis present

## 2021-07-12 MED ORDER — CEFAZOLIN SODIUM-DEXTROSE 1-4 GM/50ML-% IV SOLN
1.0000 g | Freq: Once | INTRAVENOUS | Status: DC
  Filled 2021-07-12: qty 50

## 2021-07-12 MED ORDER — TETANUS-DIPHTH-ACELL PERTUSSIS 5-2.5-18.5 LF-MCG/0.5 IM SUSY
0.5000 mL | PREFILLED_SYRINGE | Freq: Once | INTRAMUSCULAR | Status: AC
  Administered 2021-07-12: 0.5 mL via INTRAMUSCULAR
  Filled 2021-07-12: qty 0.5

## 2021-07-12 MED ORDER — LIDOCAINE HCL (PF) 1 % IJ SOLN
30.0000 mL | Freq: Once | INTRAMUSCULAR | Status: AC
  Administered 2021-07-12: 30 mL
  Filled 2021-07-12: qty 30

## 2021-07-12 MED ORDER — CEFAZOLIN SODIUM 1 G IJ SOLR
1.0000 g | Freq: Once | INTRAMUSCULAR | Status: AC
  Administered 2021-07-12: 1 g via INTRAMUSCULAR
  Filled 2021-07-12: qty 10

## 2021-07-12 MED ORDER — TRAMADOL HCL 50 MG PO TABS: 50.0000 mg | ORAL_TABLET | Freq: Four times a day (QID) | ORAL | 0 refills | Status: DC | PRN

## 2021-07-12 NOTE — ED Provider Notes (Signed)
[facility] DEPT Provider Note   CSN: [id number] Arrival date & time: 07/12/21  1211     History Chief Complaint  Patient presents with   Hand Injury    Michelle Nunez is a 25 y.o. female who presents emergency department with chief complaint of right hand injury.  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  She denies any numbness or tingling.  She is right-hand dominant.  She states that the pain is fairly severe.  This occurred approximately 4 hours ago.  She is unsure of her last tetanus vaccination.  The history is provided by the patient.  Hand Injury     Past Medical History:  Diagnosis Date   Eczema 03/20/2012    There are no problems to display for this patient.   Past Surgical History:  Procedure Laterality Date   NO PAST SURGERIES       OB History     Gravida  1   Para  1   Term  1   Preterm      AB      Living  1      SAB      IAB      Ectopic      Multiple  0   Live Births  1           Family History  Problem Relation Age of Onset   Hypertension Father    Breast cancer Maternal Aunt    Prostate cancer Maternal Uncle     Social History   Tobacco Use   Smoking status: Never   Smokeless tobacco: Never  Vaping Use   Vaping Use: Never used  Substance Use Topics   Alcohol use: No    Alcohol/week: 0.0 standard drinks   Drug use: No    Home Medications Prior to Admission medications   Medication Sig Start Date End Date Taking? Authorizing Provider  acetaminophen (TYLENOL) 325 MG tablet Take 2 tablets (650 mg total) by mouth every 4 (four) hours as needed for moderate pain (for pain scale < 4). Patient not taking: Reported on 02/26/2018 11/18/15   Smiley Houseman, MD  ibuprofen (ADVIL,MOTRIN) 600 MG tablet Take 1 tablet  (600 mg total) by mouth every 6 (six) hours as needed for moderate pain. Patient not taking: Reported on 02/26/2018 11/18/15   Smiley Houseman, MD  naproxen (NAPROSYN) 500 MG tablet Take 1 tablet (500 mg total) by mouth 2 (two) times daily with a meal. As needed for pain Patient not taking: Reported on 12/09/2018 07/20/18   Dorie Rank, MD  ondansetron (ZOFRAN ODT) 4 MG disintegrating tablet Take 1 tablet (4 mg total) by mouth every 8 (eight) hours as needed for nausea or vomiting. 03/25/20   Fondaw, Kathleene Hazel, PA  ondansetron (ZOFRAN) 8 MG tablet Take 1 tablet (8 mg total) by mouth every 8 (eight) hours as needed for nausea or vomiting. Patient not taking: Reported on 12/09/2018 02/26/18   Dutch Quint B, FNP  senna-docusate (SENOKOT-S) 8.6-50 MG tablet Take 1 tablet by mouth at bedtime. Patient not taking: Reported on 02/26/2018 11/18/15   Smiley Houseman, MD  SPRINTEC 28 0.25-35 MG-MCG tablet TAKE 1 TABLET BY MOUTH DAILY. Patient not taking: Reported on 02/26/2018 10/20/16   Verita Schneiders  A, MD    Allergies    Patient has no known allergies.  Review of Systems   Review of Systems Ten systems reviewed and are negative for acute change, except as noted in the HPI.   Physical Exam Updated Vital Signs BP (!) 143/88 (BP Location: Left Arm)   Pulse 74   Temp 98.4 F (36.9 C) (Oral)   Resp 18   SpO2 99%   Physical Exam Vitals and nursing note reviewed.  Constitutional:      General: She is not in acute distress.    Appearance: She is well-developed. She is not diaphoretic.  HENT:     Head: Normocephalic and atraumatic.     Right Ear: External ear normal.     Left Ear: External ear normal.     Nose: Nose normal.     Mouth/Throat:     Mouth: Mucous membranes are moist.  Eyes:     General: No scleral icterus.    Conjunctiva/sclera: Conjunctivae normal.  Cardiovascular:     Rate and Rhythm: Normal rate and regular rhythm.     Heart sounds: Normal heart sounds. No murmur heard.    No friction rub. No gallop.  Pulmonary:     Effort: Pulmonary effort is normal. No respiratory distress.     Breath sounds: Normal breath sounds.  Abdominal:     General: Bowel sounds are normal. There is no distension.     Palpations: Abdomen is soft. There is no mass.     Tenderness: There is no abdominal tenderness. There is no guarding.  Musculoskeletal:     Cervical back: Normal range of motion.     Comments: Distal middle and ring finger on the right hand with nail avulsion injury.  The ring finger has near complete removal of the nail which was torn away from the finger below the level of the nail matrix.  There is deep bruising on the pad of the finger.  Capillary refills less than 2 seconds.  The middle finger has a small area of erythema on the radial side below the nailbed without significant bruising or deformity.  Skin:    General: Skin is warm and dry.  Neurological:     Mental Status: She is alert and oriented to person, place, and time.  Psychiatric:        Behavior: Behavior normal.    ED Results / Procedures / Treatments   Labs (all labs ordered are listed, but only abnormal results are displayed) Labs Reviewed - No data to display  EKG None  Radiology DG Hand Complete Right  Result Date: 07/12/2021 CLINICAL DATA:  Right ring and middle finger pain after work injury. EXAM: RIGHT HAND - COMPLETE 3+ VIEW COMPARISON:  None. FINDINGS: There is a comminuted, minimally displaced fracture involving tuft of the fourth digit without intra-articular extension. Expected adjacent soft tissue swelling. No radiopaque foreign body. No additional fractures are identified. Joint spaces are preserved. No erosions. IMPRESSION: Acute, comminuted, minimally displaced fracture involving the tuft of the fourth digit without intra-articular extension. Electronically Signed   By: Sandi Mariscal M.D.   On: 07/12/2021 15:36    Procedures .Nail Removal  Date/Time: 07/12/2021 5:20 PM Performed  by: Margarita Mail, PA-C Authorized by: Margarita Mail, PA-C   Consent:    Consent obtained:  Verbal   Consent given by:  Patient   Risks discussed:  Bleeding, incomplete removal, infection, pain and permanent nail deformity   Alternatives discussed:  No treatment Universal protocol:  Patient identity confirmed:  Provided demographic data Location:    Hand:  R ring finger Pre-procedure details:    Skin preparation:  Povidone-iodine   Preparation: Patient was prepped and draped in the usual sterile fashion   Anesthesia:    Anesthesia method:  Nerve block   Block location:  Mcp 4th digit R   Block needle gauge:  25 G   Block anesthetic:  Lidocaine 1% w/o epi   Block technique:  Mcp   Block injection procedure:  Anatomic landmarks identified   Block outcome:  Anesthesia achieved Nail Removal:    Nail removed:  Complete   Nail bed repaired: no     Removed nail replaced and anchored: yes     Stented with:  Native nail 3 SI sutures placed with 3.0 sutures Nails trimmed:    Number of nails trimmed:  1 Post-procedure details:    Dressing:  Splint, petrolatum-impregnated gauze and gauze roll   Procedure completion:  Tolerated well, no immediate complications .Splint Application  Date/Time: 07/12/2021 5:22 PM Performed by: Margarita Mail, PA-C Authorized by: Margarita Mail, PA-C   Consent:    Consent obtained:  Verbal   Consent given by:  Patient   Risks, benefits, and alternatives were discussed: yes     Risks discussed:  Discoloration, numbness, swelling and pain   Alternatives discussed:  No treatment Universal protocol:    Patient identity confirmed:  Provided demographic data Pre-procedure details:    Distal neurologic exam:  Normal   Distal perfusion: brisk capillary refill   Procedure details:    Location:  Finger   Finger location:  R ring finger   Splint type:  Finger   Supplies:  Aluminum splint   Attestation: Splint applied and adjusted personally by me    Post-procedure details:    Distal neurologic exam:  Normal   Distal perfusion: brisk capillary refill     Procedure completion:  Tolerated well, no immediate complications   Medications Ordered in ED Medications  Tdap (BOOSTRIX) injection 0.5 mL (0.5 mLs Intramuscular Given 07/12/21 1544)  lidocaine (PF) (XYLOCAINE) 1 % injection 30 mL (30 mLs Infiltration Given 07/12/21 1543)    ED Course  I have reviewed the triage vital signs and the nursing notes.  Pertinent labs & imaging results that were available during my care of the patient were reviewed by me and considered in my medical decision making (see chart for details).    MDM Rules/Calculators/A&P                          25 year old female here with right ring finger injury after dropping a pipe on her finger at work.  I ordered and reviewed a right hand x-ray which shows a distal tuft fracture.  After removing the nail there was no evidence of laceration to the nailbed.  Patient's tetanus vaccine was updated.  She was given 1 g of IM Ancef.  I will discharge the patient with pain control medications as well as 1 week of Keflex she is to follow-up with hand surgery for reevaluation of the finger and suture removal.  She wrote return to the ER for any new or worsening symptoms.  Patient appears otherwise appropriate for discharge at this time.  Final Clinical Impression(s) / ED Diagnoses Final diagnoses:  None    Rx / DC Orders ED Discharge Orders     None        Margarita Mail, PA-C 07/12/21 1726  Teressa Lower, MD 07/13/21 0030

## 2021-07-12 NOTE — ED Notes (Signed)
Pt called and no answere

## 2021-07-12 NOTE — ED Provider Notes (Signed)
Emergency Medicine Provider Triage Evaluation Note  Maryline Schmittou Requejo , a 25 y.o. female  was evaluated in triage.  Pt complains of right ring and middle finger pain 2/2 injuring her fingers with a metal pipe at work. Patient is not sure whether the fingers were crushed exactly but she feels like they were because she has burising on the underside of the ring finger. The nail of the ring finger is halfway removed and bleeding lightly.  Review of Systems  Positive: Hand pain Negative: Blood thinners  Physical Exam  BP (!) 143/88 (BP Location: Left Arm)   Pulse 74   Temp 98.4 F (36.9 C) (Oral)   Resp 18   SpO2 99%  Gen:   Awake, no distress   Resp:  Normal effort  MSK:   Moves extremities without difficulty, Right hand: unable to flex DIP of ring finger, strength reduced in ring finger, Other:  Partially avulsed nail of right ring finger  Medical Decision Making  Medically screening exam initiated at 12:54 PM.  Appropriate orders placed.  Ottis K Mallin was informed that the remainder of the evaluation will be completed by another provider, this initial triage assessment does not replace that evaluation, and the importance of remaining in the ED until their evaluation is complete.  Finger injury   Dorien Chihuahua 07/12/21 1256    Valarie Merino, MD 07/13/21 2222

## 2021-07-12 NOTE — ED Triage Notes (Signed)
Pt has right hand pain since lifting a pipe at work today, it lifted up the nail of her right ring and middle finger.

## 2021-07-12 NOTE — Discharge Instructions (Addendum)
WOUND CARE Please have your stitches/staples removed in 10-14 days or sooner if you have concerns. You may do this at any available urgent care or at your primary care doctor's office.  Keep area clean and dry for 24 hours. Do not remove bandage, if applied.  After 24 hours, remove bandage and wash wound gently with mild soap and warm water. Reapply a new bandage after cleaning wound, if directed.  Continue daily cleansing with soap and water until stitches/staples are removed.  Do not apply any ointments or creams to the wound while stitches/staples are in place, as this may cause delayed healing.  Seek medical careif you experience any of the following signs of infection: Swelling, redness, pus drainage, streaking, fever >101.0 F  Seek care if you experience excessive bleeding that does not stop after 15-20 minutes of constant, firm pressure.

## 2021-07-12 NOTE — ED Notes (Signed)
Dr. Tinnie Gens, [place], [place] and Judson Roch, [place] at bedside.

## 2021-08-29 ENCOUNTER — Emergency Department (HOSPITAL_COMMUNITY): Payer: Medicaid Other

## 2021-08-29 ENCOUNTER — Emergency Department (HOSPITAL_COMMUNITY)
Admission: EM | Admit: 2021-08-29 | Discharge: 2021-08-29 | Disposition: A | Discharge status: Home / Self Care | Payer: Medicaid Other | Attending: Emergency Medicine | Admitting: Emergency Medicine

## 2021-08-29 ENCOUNTER — Other Ambulatory Visit: Payer: Self-pay

## 2021-08-29 ENCOUNTER — Other Ambulatory Visit (HOSPITAL_COMMUNITY): Payer: Self-pay

## 2021-08-29 ENCOUNTER — Encounter (HOSPITAL_COMMUNITY): Payer: Self-pay

## 2021-08-29 DIAGNOSIS — M79641 Pain in right hand: Secondary | ICD-10-CM | POA: Diagnosis present

## 2021-08-29 DIAGNOSIS — L03011 Cellulitis of right finger: Secondary | ICD-10-CM | POA: Diagnosis not present

## 2021-08-29 MED ORDER — SULFAMETHOXAZOLE-TRIMETHOPRIM 800-160 MG PO TABS
1.0000 | ORAL_TABLET | Freq: Once | ORAL | Status: AC
  Administered 2021-08-29: 1 via ORAL
  Filled 2021-08-29: qty 1

## 2021-08-29 MED ORDER — SULFAMETHOXAZOLE-TRIMETHOPRIM 800-160 MG PO TABS
1.0000 | ORAL_TABLET | Freq: Two times a day (BID) | ORAL | 0 refills | Status: AC
  Filled 2021-08-29: qty 14, 7d supply, fill #0

## 2021-08-29 NOTE — ED Triage Notes (Signed)
Patient came in 1 month ago, broke her finger, she went to a finger doctor. Ever since she left her finger has been bothering her. She thinks her nail bed is infected. It is painful, swollen and warm to touch. Her right ring finger has a white color tone to it.

## 2021-08-29 NOTE — Discharge Instructions (Addendum)
I called your hand surgeon's office.  They wanted me to start you on antibiotics and see you in the office tomorrow.  Please call them today to try and set up an appointment.

## 2021-08-29 NOTE — ED Provider Notes (Signed)
[facility] Provider Note   CSN: [id number] Arrival date & time: 08/29/21  8315     History Chief Complaint  Patient presents with   Hand Pain    Michelle Nunez is a 25 y.o. female.  25 yo F with a cc of concern for a finger infection.  Patient had a fractured finger about a month ago and has seen hand surgery in the office.  She has developed some swelling to the area as well as some discoloration.  She is concerned it may be infected and so came into the ED for evaluation.  No fevers or chills.  The history is provided by the patient.  Hand Pain This is a new problem. The problem occurs constantly. The problem has not changed since onset.Pertinent negatives include no chest pain, no headaches and no shortness of breath. Nothing aggravates the symptoms. Nothing relieves the symptoms. She has tried nothing for the symptoms. The treatment provided no relief.      Past Medical History:  Diagnosis Date   Eczema 03/20/2012    There are no problems to display for this patient.   Past Surgical History:  Procedure Laterality Date   NO PAST SURGERIES       OB History     Gravida  1   Para  1   Term  1   Preterm      AB      Living  1      SAB      IAB      Ectopic      Multiple  0   Live Births  1           Family History  Problem Relation Age of Onset   Hypertension Father    Breast cancer Maternal Aunt    Prostate cancer Maternal Uncle     Social History   Tobacco Use   Smoking status: Never   Smokeless tobacco: Never  Vaping Use   Vaping Use: Never used  Substance Use Topics   Alcohol use: No    Alcohol/week: 0.0 standard drinks   Drug use: No    Home Medications Prior to Admission medications   Medication Sig Start Date End Date Taking? Authorizing Provider  sulfamethoxazole-trimethoprim (BACTRIM DS) 800-160 MG tablet Take 1 tablet by mouth 2 (two) times daily for 7 days. 08/29/21 09/05/21 Yes  Michelle Nunez  acetaminophen (TYLENOL) 325 MG tablet Take 2 tablets (650 mg total) by mouth every 4 (four) hours as needed for moderate pain (for pain scale < 4). Patient not taking: Reported on 02/26/2018 11/18/15   Michelle Nunez  ibuprofen (ADVIL,MOTRIN) 600 MG tablet Take 1 tablet (600 mg total) by mouth every 6 (six) hours as needed for moderate pain. Patient not taking: Reported on 02/26/2018 11/18/15   Michelle Nunez  naproxen (NAPROSYN) 500 MG tablet Take 1 tablet (500 mg total) by mouth 2 (two) times daily with a meal. As needed for pain Patient not taking: Reported on 12/09/2018 07/20/18   Michelle Nunez  ondansetron (ZOFRAN ODT) 4 MG disintegrating tablet Take 1 tablet (4 mg total) by mouth every 8 (eight) hours as needed for nausea or vomiting. 03/25/20   Michelle Nunez  ondansetron (ZOFRAN) 8 MG tablet Take 1 tablet (8 mg total) by mouth every 8 (eight) hours as needed for nausea or vomiting. Patient not taking: Reported on 12/09/2018 02/26/18   Michelle Nunez  senna-docusate (SENOKOT-S) 8.6-50 MG tablet Take 1 tablet by mouth at bedtime. Patient not taking: Reported on 02/26/2018 11/18/15   Michelle Nunez  SPRINTEC 28 0.25-35 MG-MCG tablet TAKE 1 TABLET BY MOUTH DAILY. Patient not taking: Reported on 02/26/2018 10/20/16   Michelle Nunez  traMADol (ULTRAM) 50 MG tablet Take 1 tablet (50 mg total) by mouth every 6 (six) hours as needed. 07/12/21   Michelle Nunez    Allergies    Patient has no known allergies.  Review of Systems   Review of Systems  Constitutional:  Negative for chills and fever.  HENT:  Negative for congestion and rhinorrhea.   Eyes:  Negative for redness and visual disturbance.  Respiratory:  Negative for shortness of breath and wheezing.   Cardiovascular:  Negative for chest pain and palpitations.  Gastrointestinal:  Negative for nausea and vomiting.  Genitourinary:  Negative for dysuria and urgency.  Musculoskeletal:   Negative for arthralgias and myalgias.  Skin:  Positive for color change. Negative for pallor and wound.  Neurological:  Negative for dizziness and headaches.   Physical Exam Updated Vital Signs BP (!) 142/71 (BP Location: Right Arm)   Pulse 78   Temp 98.4 F (36.9 C) (Oral)   Resp 19   Ht 5\' 5"  (1.651 m)   Wt 86.2 kg   SpO2 100%   BMI 31.62 kg/m   Physical Exam Vitals and nursing note reviewed.  Constitutional:      General: She is not in acute distress.    Appearance: She is well-developed. She is not diaphoretic.  HENT:     Head: Normocephalic and atraumatic.  Eyes:     Pupils: Pupils are equal, round, and reactive to light.  Cardiovascular:     Rate and Rhythm: Normal rate and regular rhythm.     Heart sounds: No murmur heard.   No friction rub. No gallop.  Pulmonary:     Effort: Pulmonary effort is normal.     Breath sounds: No wheezing or rales.  Abdominal:     General: There is no distension.     Palpations: Abdomen is soft.     Tenderness: There is no abdominal tenderness.  Musculoskeletal:        General: Swelling and tenderness present.     Cervical back: Normal range of motion and neck supple.     Comments: Patient with some elevation of the nailbed clinically discoloration at the base of the nail as well as some discoloration to the radial aspect of the nail bed. Full rom.  Pain much less than expected.  Skin:    General: Skin is warm and dry.  Neurological:     Mental Status: She is alert and oriented to person, place, and time.  Psychiatric:        Behavior: Behavior normal.       ED Results / Procedures / Treatments   Labs (all labs ordered are listed, but only abnormal results are displayed) Labs Reviewed - No data to display  EKG None  Radiology DG Finger Ring Right  Result Date: 08/29/2021 CLINICAL DATA:  Swelling, recent fracture EXAM: RIGHT RING FINGER 2+V COMPARISON:  07/12/2021 FINDINGS: Stable position of comminuted displaced  fracture of the tuft distal phalanx right fourth finger. Fracture margins are somewhat less distinct. No significant periosteal reaction. No convincing focal cortical loss suggestive of osteomyelitis. There is moderate regional soft tissue swelling. No subcutaneous gas or radiodense foreign body. IMPRESSION: [place] interval healing response  about the comminuted tuft fracture, distal phalanx right ring finger. Electronically Signed   By: Lucrezia Europe M.D.   On: 08/29/2021 07:36    Procedures Procedures   Medications Ordered in ED Medications  sulfamethoxazole-trimethoprim (BACTRIM DS) 800-160 MG per tablet 1 tablet (has no administration in time range)    ED Course  I have reviewed the triage vital signs and the nursing notes.  Pertinent labs & imaging results that were available during my care of the patient were reviewed by me and considered in my medical decision making (see chart for details).    MDM Rules/Calculators/A&P                           25 yo F with a chief complaint of finger pain and swelling.  This been going on for a few days.  Clinically looks like she has a paronychia though has some elevation of the nailbed and has an underlying fracture from a month ago.  Will discuss with hand surgery.  I discussed the case with Dr. Levell July office, I spoke with one of the physicians assistants that works with him [employer] nutritionist.  They recommended starting her on antibiotics and they will try and see her in the office tomorrow.  Recommended Bactrim.  8:48 AM:  I have discussed the diagnosis/risks/treatment options with the patient and believe the pt to be eligible for discharge home to follow-up with Hand surgery. We also discussed returning to the ED immediately if new or worsening sx occur. We discussed the sx which are most concerning (e.g., sudden worsening pain, fever, inability to tolerate by mouth) that necessitate immediate return. Medications administered to the patient during their  visit and any new prescriptions provided to the patient are listed below.  Medications given during this visit Medications  sulfamethoxazole-trimethoprim (BACTRIM DS) 800-160 MG per tablet 1 tablet (has no administration in time range)     The patient appears reasonably screen and/or stabilized for discharge and I doubt any other medical condition or other [facility] requiring further screening, evaluation, or treatment in the ED at this time prior to discharge.     Final Clinical Impression(s) / ED Diagnoses Final diagnoses:  Paronychia of finger, right    Rx / DC Orders ED Discharge Orders          Ordered    sulfamethoxazole-trimethoprim (BACTRIM DS) 800-160 MG tablet  2 times daily        08/29/21 0847             Michelle Nunez 08/29/21 0848

## 2021-08-30 ENCOUNTER — Other Ambulatory Visit: Payer: Self-pay

## 2021-08-30 ENCOUNTER — Ambulatory Visit (HOSPITAL_COMMUNITY): Payer: Medicaid Other | Admitting: Anesthesiology

## 2021-08-30 ENCOUNTER — Other Ambulatory Visit: Payer: Self-pay | Admitting: Orthopedic Surgery

## 2021-08-30 ENCOUNTER — Encounter (HOSPITAL_COMMUNITY): Payer: Self-pay | Admitting: Orthopedic Surgery

## 2021-08-30 ENCOUNTER — Encounter (HOSPITAL_COMMUNITY)
Admission: RE | Disposition: A | Discharge status: Home / Self Care | Payer: Self-pay | Source: Home / Self Care | Attending: Orthopedic Surgery

## 2021-08-30 ENCOUNTER — Ambulatory Visit (HOSPITAL_COMMUNITY)
Admission: RE | Admit: 2021-08-30 | Discharge: 2021-08-30 | Disposition: A | Discharge status: Home / Self Care | Payer: Medicaid Other | Attending: Orthopedic Surgery | Admitting: Orthopedic Surgery

## 2021-08-30 DIAGNOSIS — Z79899 Other long term (current) drug therapy: Secondary | ICD-10-CM | POA: Diagnosis not present

## 2021-08-30 DIAGNOSIS — L03011 Cellulitis of right finger: Secondary | ICD-10-CM | POA: Diagnosis present

## 2021-08-30 DIAGNOSIS — Z791 Long term (current) use of non-steroidal anti-inflammatories (NSAID): Secondary | ICD-10-CM | POA: Diagnosis not present

## 2021-08-30 HISTORY — PX: I & D EXTREMITY: SHX5045

## 2021-08-30 LAB — POCT I-STAT, CHEM 8
BUN: 9 mg/dL (ref 6–20)
Calcium, Ion: 1.26 mmol/L (ref 1.15–1.40)
Chloride: 103 mmol/L (ref 98–111)
Creatinine, Ser: 0.9 mg/dL (ref 0.44–1.00)
Glucose, Bld: 83 mg/dL (ref 70–99)
HCT: 50 % — ABNORMAL HIGH (ref 36.0–46.0)
Hemoglobin: 17 g/dL — ABNORMAL HIGH (ref 12.0–15.0)
Potassium: 3.6 mmol/L (ref 3.5–5.1)
Sodium: 140 mmol/L (ref 135–145)
TCO2: 24 mmol/L (ref 22–32)

## 2021-08-30 LAB — POCT PREGNANCY, URINE: Preg Test, Ur: NEGATIVE

## 2021-08-30 SURGERY — IRRIGATION AND DEBRIDEMENT EXTREMITY
Anesthesia: General | Site: Hand | Laterality: Right

## 2021-08-30 MED ORDER — ONDANSETRON HCL 4 MG/2ML IJ SOLN
INTRAMUSCULAR | Status: DC | PRN
  Administered 2021-08-30: 4 mg via INTRAVENOUS

## 2021-08-30 MED ORDER — LIDOCAINE 2% (20 MG/ML) 5 ML SYRINGE
INTRAMUSCULAR | Status: DC | PRN
  Administered 2021-08-30: 50 mg via INTRAVENOUS

## 2021-08-30 MED ORDER — ORAL CARE MOUTH RINSE: 15.0000 mL | Freq: Once | OROMUCOSAL | Status: AC

## 2021-08-30 MED ORDER — CEFAZOLIN SODIUM-DEXTROSE 2-4 GM/100ML-% IV SOLN
INTRAVENOUS | Status: AC
  Filled 2021-08-30: qty 100

## 2021-08-30 MED ORDER — AMISULPRIDE (ANTIEMETIC) 5 MG/2ML IV SOLN: 10.0000 mg | Freq: Once | INTRAVENOUS | Status: DC | PRN

## 2021-08-30 MED ORDER — LACTATED RINGERS IV SOLN: INTRAVENOUS | Status: DC

## 2021-08-30 MED ORDER — HYDROCODONE-ACETAMINOPHEN 5-325 MG PO TABS: ORAL_TABLET | ORAL | 0 refills | Status: AC

## 2021-08-30 MED ORDER — PROPOFOL 10 MG/ML IV BOLUS
INTRAVENOUS | Status: AC
  Filled 2021-08-30: qty 20

## 2021-08-30 MED ORDER — CEFAZOLIN SODIUM-DEXTROSE 2-4 GM/100ML-% IV SOLN
2.0000 g | INTRAVENOUS | Status: AC
  Administered 2021-08-30: 2 g via INTRAVENOUS

## 2021-08-30 MED ORDER — DEXAMETHASONE SODIUM PHOSPHATE 10 MG/ML IJ SOLN
INTRAMUSCULAR | Status: DC | PRN
  Administered 2021-08-30: 10 mg via INTRAVENOUS

## 2021-08-30 MED ORDER — BUPIVACAINE HCL (PF) 0.25 % IJ SOLN
INTRAMUSCULAR | Status: AC
  Filled 2021-08-30: qty 30

## 2021-08-30 MED ORDER — CHLORHEXIDINE GLUCONATE 0.12 % MT SOLN
15.0000 mL | Freq: Once | OROMUCOSAL | Status: AC
  Administered 2021-08-30: 15 mL via OROMUCOSAL
  Filled 2021-08-30: qty 15

## 2021-08-30 MED ORDER — BUPIVACAINE HCL (PF) 0.25 % IJ SOLN
INTRAMUSCULAR | Status: DC | PRN
  Administered 2021-08-30: 10 mL

## 2021-08-30 MED ORDER — PROPOFOL 10 MG/ML IV BOLUS
INTRAVENOUS | Status: DC | PRN
Start: 2021-08-30 — End: 2021-08-30
  Administered 2021-08-30: 100 mg via INTRAVENOUS
  Administered 2021-08-30 (×2): 200 mg via INTRAVENOUS
  Administered 2021-08-30: 100 mg via INTRAVENOUS

## 2021-08-30 MED ORDER — MIDAZOLAM HCL 2 MG/2ML IJ SOLN
INTRAMUSCULAR | Status: DC | PRN
  Administered 2021-08-30: 2 mg via INTRAVENOUS

## 2021-08-30 MED ORDER — MIDAZOLAM HCL 2 MG/2ML IJ SOLN
INTRAMUSCULAR | Status: AC
  Filled 2021-08-30: qty 2

## 2021-08-30 MED ORDER — FENTANYL CITRATE (PF) 100 MCG/2ML IJ SOLN: 25.0000 ug | INTRAMUSCULAR | Status: DC | PRN

## 2021-08-30 MED ORDER — FENTANYL CITRATE (PF) 250 MCG/5ML IJ SOLN
INTRAMUSCULAR | Status: AC
  Filled 2021-08-30: qty 5

## 2021-08-30 MED ORDER — 0.9 % SODIUM CHLORIDE (POUR BTL) OPTIME
TOPICAL | Status: DC | PRN
  Administered 2021-08-30: 1000 mL

## 2021-08-30 MED ORDER — ACETAMINOPHEN 500 MG PO TABS: 1000.0000 mg | ORAL_TABLET | Freq: Once | ORAL | Status: DC

## 2021-08-30 SURGICAL SUPPLY — 55 items
ADAPTER CATH SYR TO TUBING 38M (ADAPTER) ×2 IMPLANT
BAG COUNTER SPONGE SURGICOUNT (BAG) ×2 IMPLANT
BNDG COHESIVE 1X5 TAN STRL LF (GAUZE/BANDAGES/DRESSINGS) ×2 IMPLANT
BNDG COHESIVE 2X5 TAN STRL LF (GAUZE/BANDAGES/DRESSINGS) IMPLANT
BNDG ELASTIC 3X5.8 VLCR STR LF (GAUZE/BANDAGES/DRESSINGS) ×2 IMPLANT
BNDG ELASTIC 4X5.8 VLCR STR LF (GAUZE/BANDAGES/DRESSINGS) ×2 IMPLANT
BNDG ESMARK 4X9 LF (GAUZE/BANDAGES/DRESSINGS) ×2 IMPLANT
BNDG GAUZE ELAST 4 BULKY (GAUZE/BANDAGES/DRESSINGS) ×2 IMPLANT
CANNULA VESSEL 3MM 2 BLNT TIP (CANNULA) IMPLANT
CORD BIPOLAR FORCEPS 12FT (ELECTRODE) ×2 IMPLANT
COVER SURGICAL LIGHT HANDLE (MISCELLANEOUS) ×2 IMPLANT
CUFF TOURN SGL QUICK 18X4 (TOURNIQUET CUFF) ×2 IMPLANT
CUFF TOURN SGL QUICK 24 (TOURNIQUET CUFF)
CUFF TRNQT CYL 24X4X16.5-23 (TOURNIQUET CUFF) IMPLANT
DECANTER SPIKE VIAL GLASS SM (MISCELLANEOUS) ×2 IMPLANT
DRAIN PENROSE 1/4X12 LTX STRL (WOUND CARE) IMPLANT
DRSG PAD ABDOMINAL 8X10 ST (GAUZE/BANDAGES/DRESSINGS) ×4 IMPLANT
DRSG XEROFORM 1X8 (GAUZE/BANDAGES/DRESSINGS) ×2 IMPLANT
GAUZE PACKING IODOFORM 1/4X15 (PACKING) ×2 IMPLANT
GAUZE SPONGE 4X4 12PLY STRL (GAUZE/BANDAGES/DRESSINGS) ×2 IMPLANT
GAUZE XEROFORM 1X8 LF (GAUZE/BANDAGES/DRESSINGS) ×2 IMPLANT
GLOVE SRG 8 PF TXTR STRL LF DI (GLOVE) ×1 IMPLANT
GLOVE SURG ENC MOIS LTX SZ7.5 (GLOVE) ×2 IMPLANT
GLOVE SURG ORTHO LTX SZ8 (GLOVE) IMPLANT
GLOVE SURG POLY ORTHO LF SZ8 (GLOVE) IMPLANT
GLOVE SURG UNDER POLY LF SZ8 (GLOVE) ×2
GLOVE SURG UNDER POLY LF SZ8.5 (GLOVE) IMPLANT
GOWN STRL REUS W/ TWL LRG LVL3 (GOWN DISPOSABLE) ×1 IMPLANT
GOWN STRL REUS W/ TWL XL LVL3 (GOWN DISPOSABLE) ×1 IMPLANT
GOWN STRL REUS W/TWL LRG LVL3 (GOWN DISPOSABLE) ×2
GOWN STRL REUS W/TWL XL LVL3 (GOWN DISPOSABLE) ×2
KIT BASIN OR (CUSTOM PROCEDURE TRAY) ×2 IMPLANT
KIT TURNOVER KIT B (KITS) ×2 IMPLANT
LOOP VESSEL MAXI BLUE (MISCELLANEOUS) IMPLANT
MANIFOLD NEPTUNE II (INSTRUMENTS) IMPLANT
NEEDLE HYPO 25X1 1.5 SAFETY (NEEDLE) IMPLANT
NS IRRIG 1000ML POUR BTL (IV SOLUTION) ×2 IMPLANT
PACK ORTHO EXTREMITY (CUSTOM PROCEDURE TRAY) ×2 IMPLANT
PAD ARMBOARD 7.5X6 YLW CONV (MISCELLANEOUS) ×4 IMPLANT
SET CYSTO W/LG BORE CLAMP LF (SET/KITS/TRAYS/PACK) IMPLANT
SOL PREP POV-IOD 4OZ 10% (MISCELLANEOUS) ×4 IMPLANT
SPLINT FINGER 3.25 BULB 911905 (SOFTGOODS) ×2 IMPLANT
SPONGE T-LAP 4X18 ~~LOC~~+RFID (SPONGE) ×2 IMPLANT
SUT ETHILON 4 0 P 3 18 (SUTURE) IMPLANT
SUT ETHILON 4 0 PS 2 18 (SUTURE) IMPLANT
SUT MON AB 5-0 P3 18 (SUTURE) IMPLANT
SWAB COLLECTION DEVICE MRSA (MISCELLANEOUS) IMPLANT
SWAB CULTURE ESWAB REG 1ML (MISCELLANEOUS) ×2 IMPLANT
SYR 20ML LL LF (SYRINGE) ×2 IMPLANT
SYR CONTROL 10ML LL (SYRINGE) IMPLANT
TOWEL GREEN STERILE (TOWEL DISPOSABLE) ×2 IMPLANT
TUBE CONNECTING 12X1/4 (SUCTIONS) ×2 IMPLANT
TUBE FEEDING ENTERAL 5FR 16IN (TUBING) IMPLANT
UNDERPAD 30X36 HEAVY ABSORB (UNDERPADS AND DIAPERS) ×2 IMPLANT
YANKAUER SUCT BULB TIP NO VENT (SUCTIONS) ×2 IMPLANT

## 2021-08-30 NOTE — Transfer of Care (Signed)
Immediate Anesthesia Transfer of Care Note  Patient: Michelle Nunez  Procedure(s) Performed: IRRIGATION AND DEBRIDEMENT RING FINGER (Right: Hand)  Patient Location: PACU  Anesthesia Type:General  Level of Consciousness: awake and alert   Airway & Oxygen Therapy: Patient Spontanous Breathing and Patient connected to face mask oxygen  Post-op Assessment: Report given to RN and Post -op Vital signs reviewed and stable  Post vital signs: Reviewed and stable  Last Vitals:  Vitals Value Taken Time  BP 109/64 08/30/21 2105  Temp    Pulse 79 08/30/21 2106  Resp 20 08/30/21 2106  SpO2 100 % 08/30/21 2106  Vitals shown include unvalidated device data.  Last Pain:  Vitals:   08/30/21 1545  PainSc: 0-No pain      Patients Stated Pain Goal: 5 (82/95/62 1308)  Complications: No notable events documented.

## 2021-08-30 NOTE — Progress Notes (Signed)
Informed patient that her case has been delayed.  Patient verbalized understanding.

## 2021-08-30 NOTE — Progress Notes (Signed)
Gave report to [place], Therapist, sports.  Patient informed that her surgery might go at 7:30 pm per OR desk/ Dr Fredna Dow.

## 2021-08-30 NOTE — Anesthesia Procedure Notes (Signed)
Procedure Name: LMA Insertion Date/Time: 08/30/2021 8:15 PM Performed by: Reece Agar, CRNA Pre-anesthesia Checklist: Patient identified, Emergency Drugs available, Suction available and Patient being monitored Patient Re-evaluated:Patient Re-evaluated prior to induction Oxygen Delivery Method: Circle System Utilized Preoxygenation: Pre-oxygenation with 100% oxygen Induction Type: IV induction Ventilation: Mask ventilation without difficulty LMA: LMA inserted LMA Size: 4.0 Number of attempts: 1 Airway Equipment and Method: Bite block Placement Confirmation: positive ETCO2 Tube secured with: Tape Dental Injury: Teeth and Oropharynx as per pre-operative assessment

## 2021-08-30 NOTE — Anesthesia Preprocedure Evaluation (Signed)
Anesthesia Evaluation  Patient identified by MRN, date of birth, ID band Patient awake    Reviewed: Allergy & Precautions, NPO status , Patient's Chart, lab work & pertinent test results  Airway Mallampati: II  TM Distance: >3 FB Neck ROM: Full    Dental  (+) Dental Advisory Given   Pulmonary neg pulmonary ROS,    breath sounds clear to auscultation       Cardiovascular negative cardio ROS   Rhythm:Regular Rate:Normal     Neuro/Psych negative neurological ROS     GI/Hepatic negative GI ROS, Neg liver ROS,   Endo/Other  negative endocrine ROS  Renal/GU negative Renal ROS     Musculoskeletal   Abdominal   Peds  Hematology negative hematology ROS (+)   Anesthesia Other Findings   Reproductive/Obstetrics                             Anesthesia Physical Anesthesia Plan  ASA: 2  Anesthesia Plan: General   Post-op Pain Management:    Induction: Intravenous  PONV Risk Score and Plan: 3 and Dexamethasone, Ondansetron, Treatment may vary due to age or medical condition and Midazolam  Airway Management Planned: LMA  Additional Equipment: None  Intra-op Plan:   Post-operative Plan: Extubation in OR  Informed Consent: I have reviewed the patients History and Physical, chart, labs and discussed the procedure including the risks, benefits and alternatives for the proposed anesthesia with the patient or authorized representative who has indicated his/her understanding and acceptance.     Dental advisory given  Plan Discussed with: CRNA  Anesthesia Plan Comments:         Anesthesia Quick Evaluation

## 2021-08-30 NOTE — Discharge Instructions (Signed)

## 2021-08-30 NOTE — H&P (Signed)
Michelle Nunez is an 25 y.o. female.   Chief Complaint: Finger infection HPI: 25 year old female with crush injury to right ring for her several weeks ago.  Has had increasing swelling and pain of the finger over the past few days with what appears to be pus underneath the nail.  Seen in the office this morning.  Felt to have paronychia and early felon.  She wishes to proceed with incision and drainage.  Allergies: No Known Allergies  Past Medical History:  Diagnosis Date   Eczema 03/20/2012    Past Surgical History:  Procedure Laterality Date   NO PAST SURGERIES      Family History: Family History  Problem Relation Age of Onset   Hypertension Father    Breast cancer Maternal Aunt    Prostate cancer Maternal Uncle     Social History:   reports that she has never smoked. She has never used smokeless tobacco. She reports that she does not drink alcohol and does not use drugs.  Medications: Medications Prior to Admission  Medication Sig Dispense Refill   acetaminophen (TYLENOL) 325 MG tablet Take 2 tablets (650 mg total) by mouth every 4 (four) hours as needed for moderate pain (for pain scale < 4). (Patient not taking: Reported on 02/26/2018) 90 tablet 0   ibuprofen (ADVIL,MOTRIN) 600 MG tablet Take 1 tablet (600 mg total) by mouth every 6 (six) hours as needed for moderate pain. (Patient not taking: Reported on 02/26/2018) 30 tablet 0   naproxen (NAPROSYN) 500 MG tablet Take 1 tablet (500 mg total) by mouth 2 (two) times daily with a meal. As needed for pain (Patient not taking: Reported on 12/09/2018) 14 tablet 0   ondansetron (ZOFRAN ODT) 4 MG disintegrating tablet Take 1 tablet (4 mg total) by mouth every 8 (eight) hours as needed for nausea or vomiting. 20 tablet 0   ondansetron (ZOFRAN) 8 MG tablet Take 1 tablet (8 mg total) by mouth every 8 (eight) hours as needed for nausea or vomiting. (Patient not taking: Reported on 12/09/2018) 20 tablet 0   senna-docusate (SENOKOT-S) 8.6-50  MG tablet Take 1 tablet by mouth at bedtime. (Patient not taking: Reported on 02/26/2018) 90 tablet 0   SPRINTEC 28 0.25-35 MG-MCG tablet TAKE 1 TABLET BY MOUTH DAILY. (Patient not taking: Reported on 02/26/2018) 84 tablet 5   sulfamethoxazole-trimethoprim (BACTRIM DS) 800-160 MG tablet Take 1 tablet by mouth 2 (two) times daily for 7 days. 14 tablet 0   traMADol (ULTRAM) 50 MG tablet Take 1 tablet (50 mg total) by mouth every 6 (six) hours as needed. 15 tablet 0    No results found for this or any previous visit (from the past 48 hour(s)).  DG Finger Ring Right  Result Date: 08/29/2021 CLINICAL DATA:  Swelling, recent fracture EXAM: RIGHT RING FINGER 2+V COMPARISON:  07/12/2021 FINDINGS: Stable position of comminuted displaced fracture of the tuft distal phalanx right fourth finger. Fracture margins are somewhat less distinct. No significant periosteal reaction. No convincing focal cortical loss suggestive of osteomyelitis. There is moderate regional soft tissue swelling. No subcutaneous gas or radiodense foreign body. IMPRESSION: [place] interval healing response about the comminuted tuft fracture, distal phalanx right ring finger. Electronically Signed   By: Lucrezia Europe M.D.   On: 08/29/2021 07:36      unknown if currently breastfeeding.  General appearance: alert, cooperative, and appears stated age Head: Normocephalic, without obvious abnormality, atraumatic Neck: supple, symmetrical, trachea midline Cardio: regular rate and rhythm Resp: clear to  auscultation bilaterally Extremities: Intact sensation and capillary refill all digits.  +epl/fpl/io.  Fingernail is elevated and erythematous surrounding the nail of the right ring finger.  There is fluctuance.  Tender to palpation of the pad of the finger.  The pad is not tense. Pulses: 2+ and symmetric Skin: Skin color, texture, turgor normal. No rashes or lesions Neurologic: Grossly normal Incision/Wound: none  Assessment/Plan Right ring  finger paronychia and possibly early felon.  Recommend incision and drainage in the operating room.  Risks, benefits and alternatives of surgery were discussed including risks of blood loss, infection, damage to nerves/vessels/tendons/ligament/bone, failure of surgery, need for additional surgery, complication with wound healing, stiffness, need for repeat irrigation and debridement.  She voiced understanding of these risks and elected to proceed.    Leanora Cover 08/30/2021, 3:17 PM

## 2021-08-30 NOTE — Op Note (Signed)
NAME: Latrease Kunde Gregori MEDICAL RECORD NO: [id number] DATE OF BIRTH: 1996/01/23 FACILITY: [facility] LOCATION: [facility] OR PHYSICIAN: Tennis Must, MD   OPERATIVE REPORT   DATE OF PROCEDURE: 08/30/21    PREOPERATIVE DIAGNOSIS: Right ring finger paronychia   POSTOPERATIVE DIAGNOSIS: Right ring finger felon and paronychia   PROCEDURE: Incision and drainage right ring finger felon and paronychia   SURGEON:  Leanora Cover, M.D.   ASSISTANT: none   ANESTHESIA:  General   INTRAVENOUS FLUIDS:  Per anesthesia flow sheet.   ESTIMATED BLOOD LOSS:  Minimal.   COMPLICATIONS:  None.   SPECIMENS: Cultures to micro   TOURNIQUET TIME:   Right arm: Approximately 20 minutes at 250 mmHg   DISPOSITION:  Stable to PACU.   INDICATIONS: 25 year old female sustained a crush injury to the right ring finger several weeks ago.  Over the past few days she has had increasing swelling pain and erythema to the finger.  She was seen in the office this morning and noted to have a fluctuant area around the nail.  She had tenderness to palpation of the pad of the finger.  Recommended incision and drainage in the operating room.  Risks, benefits and alternatives of surgery were discussed including the risks of blood loss, infection, damage to nerves, vessels, tendons, ligaments, bone for surgery, need for additional surgery, complications with wound healing, continued pain, stiffness, need for repeat irrigation and debridement r.  She voiced understanding of these risks and elected to proceed.  OPERATIVE COURSE:  After being identified preoperatively by myself,  the patient and I agreed on the procedure and site of the procedure.  The surgical site was marked.  Surgical consent had been signed. She was given IV antibiotics as preoperative antibiotic prophylaxis. She was transferred to the operating room and placed on the operating table in supine position with the Right upper extremity on an arm board.  General anesthesia  was induced by the anesthesiologist.  Right upper extremity was prepped and draped in normal sterile orthopedic fashion.  A surgical pause was performed between the surgeons, anesthesia, and operating room staff and all were in agreement as to the patient, procedure, and site of procedure.  Tourniquet at the proximal aspect of the extremity was inflated to 250 mmHg after exsanguination of the arm with an Esmarch bandage.  There is a small wound at the radial side of the fingernail.  There was purulence coming from this.  Cultures were taken for aerobes and aerobes.  A digital block was performed with 10 cc of quarter percent plain Marcaine.  The nail was removed with a freer elevator.  There was white and black material underneath.  This was removed with the pickups and scissors.  There was a new nail that it started growing in.  The new nail was removed as well.  There was a area of granulation tissue centrally in the nailbed of approximately 3 to 4 mm in diameter.  This was debrided as well using the scissors.  At the radial gutter there was a tract coursing down to the pad of the finger.  Incision was made at the radial side of the distal phalanx.  The scissor was used to spread the septae of the pad of the finger.  No gross purulence was encountered.  The wound and nailbed were copiously irrigated with sterile saline.  A piece of Xeroform was placed on the nailbed and into the nail fold.  The wound was packed with quarter inch iodoform  gauze.  A small gauze wick was placed in the tract that was coursing through the radial gutter.  A second piece of Xeroform was placed over top of the nailbed while allowing area for drainage on the radial side.  The wound was then dressed with sterile 4 x 4 and wrapped with a Coban dressing lightly.  An AlumaFoam splint was placed and wrapped lightly with Coban dressing.  The tourniquet was deflated at approximately 20 minutes.  Fingertips were pink with brisk capillary refill  after deflation of tourniquet.  The operative  drapes were broken down.  The patient was awoken from anesthesia safely.  She was transferred back to the stretcher and taken to PACU in stable condition.  I will see her back in the office in 3-4 days for postoperative followup.  I will give her a prescription for Norco 5/325 1-2 tabs PO q6 hours prn pain, dispense # 20.  She will continue the Bactrim DS 1 p.o. twice daily.  Debridement type: Excisional Debridement  Side: right  Body Location: Ring finger  Tools used for debridement: scissors   Debridement depth beyond dead/damaged tissue down to healthy viable tissue: yes  Tissue layer involved: skin, subcutaneous tissue, muscle / fascia  Nature of tissue removed: Non-viable tissue and Purulence  Irrigation volume: 200 cc     Irrigation fluid type: Normal Saline    Leanora Cover, MD Electronically signed, 08/30/21

## 2021-08-30 NOTE — Anesthesia Postprocedure Evaluation (Signed)
Anesthesia Post Note  Patient: Michelle Nunez  Procedure(s) Performed: IRRIGATION AND DEBRIDEMENT RING FINGER (Right: Hand)     Patient location during evaluation: PACU Anesthesia Type: General Level of consciousness: awake and alert Pain management: pain level controlled Vital Signs Assessment: post-procedure vital signs reviewed and stable Respiratory status: spontaneous breathing, nonlabored ventilation, respiratory function stable and patient connected to nasal cannula oxygen Cardiovascular status: blood pressure returned to baseline and stable Postop Assessment: no apparent nausea or vomiting Anesthetic complications: no   No notable events documented.  Last Vitals:  Vitals:   08/30/21 2122 08/30/21 2137  BP: 119/75 121/76  Pulse: 73 74  Resp: 19 16  Temp:  36.4 C  SpO2: 98% 98%    Last Pain:  Vitals:   08/30/21 2137  PainSc: 0-No pain                 Tiajuana Amass

## 2021-08-31 ENCOUNTER — Encounter (HOSPITAL_COMMUNITY): Payer: Self-pay | Admitting: Orthopedic Surgery

## 2021-09-05 LAB — AEROBIC/ANAEROBIC CULTURE W GRAM STAIN (SURGICAL/DEEP WOUND)
Culture: NO GROWTH
Gram Stain: NONE SEEN

## 2021-09-30 LAB — FUNGUS CULTURE WITH STAIN

## 2021-09-30 LAB — FUNGUS CULTURE RESULT

## 2021-09-30 LAB — FUNGAL ORGANISM REFLEX

## 2023-11-19 ENCOUNTER — Other Ambulatory Visit: Payer: Self-pay | Admitting: Nurse Practitioner

## 2023-11-21 LAB — CYTOLOGY - PAP: Diagnosis: NEGATIVE
